# Patient Record
Sex: Female | Born: 1990 | Race: White | Hispanic: No | Marital: Single | Smoking: Former smoker
Health system: Southern US, Community
[De-identification: ages and names within clinical notes are randomized; demographics above are authoritative.]

## PROBLEM LIST (undated history)

## (undated) DIAGNOSIS — G43909 Migraine, unspecified, not intractable, without status migrainosus: Secondary | ICD-10-CM

## (undated) DIAGNOSIS — R479 Unspecified speech disturbances: Secondary | ICD-10-CM

## (undated) DIAGNOSIS — E669 Obesity, unspecified: Secondary | ICD-10-CM

## (undated) HISTORY — DX: Obesity, unspecified: E66.9

## (undated) HISTORY — DX: Unspecified speech disturbances: R47.9

---
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
## 2011-11-01 ENCOUNTER — Encounter: Payer: Self-pay | Admitting: Gynecology

## 2011-11-02 ENCOUNTER — Encounter: Payer: Self-pay | Admitting: Women's Health

## 2011-11-02 ENCOUNTER — Ambulatory Visit (INDEPENDENT_AMBULATORY_CARE_PROVIDER_SITE_OTHER): Payer: 59 | Admitting: Women's Health

## 2011-11-02 VITALS — BP 122/78 | Ht 64.0 in | Wt 161.0 lb

## 2011-11-02 DIAGNOSIS — Z01419 Encounter for gynecological examination (general) (routine) without abnormal findings: Secondary | ICD-10-CM

## 2011-11-02 DIAGNOSIS — Z113 Encounter for screening for infections with a predominantly sexual mode of transmission: Secondary | ICD-10-CM

## 2011-11-02 LAB — HEPATITIS C ANTIBODY: HCV Ab: NEGATIVE

## 2011-11-02 LAB — HEPATITIS B SURFACE ANTIGEN: Hepatitis B Surface Ag: NEGATIVE

## 2011-11-02 NOTE — Progress Notes (Signed)
Pamela Thornton 06-15-1991 [id number]    History:    The patient presents for annual exam.  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   Past medical history, past surgical history, family history and social history were all reviewed and documented in the EPIC chart.   ROS:  A  ROS was performed and pertinent positives and negatives are included in the history.  Exam:  Filed Vitals:   11/02/11 1052  BP: 122/78    General appearance:  Normal Head/Neck:  Normal, without cervical or supraclavicular adenopathy. Thyroid:  Symmetrical, normal in size, without palpable masses or nodularity. Respiratory  Effort:  Normal  Auscultation:  Clear without wheezing or rhonchi Cardiovascular  Auscultation:  Regular rate, without rubs, murmurs or gallops  Edema/varicosities:  Not grossly evident Abdominal  Soft,nontender, without masses, guarding or rebound.  Liver/spleen:  No organomegaly noted  Hernia:  None appreciated  Skin  Inspection:  Grossly normal  Palpation:  Grossly normal Neurologic/psychiatric  Orientation:  Normal with appropriate conversation.  Mood/affect:  Normal  Genitourinary    Breasts: Examined lying and sitting.     Right: Without masses, retractions, discharge or axillary adenopathy.     Left: Without masses, retractions, discharge or axillary adenopathy.   Inguinal/mons:  Normal without inguinal adenopathy  External genitalia:  Normal  BUS/Urethra/Skene's glands:  Normal  Bladder:  Normal  Vagina:  Normal  Cervix:  Normal  Uterus:   normal in size, shape and contour.  Midline and mobile  Adnexa/parametria:     Rt: Without masses or tenderness.   Lt: Without masses or tenderness.  Anus and perineum: Normal  Digital rectal exam:   Assessment/Plan:  20 y.o. S WF G0 for annual exam. Implanon, left arm, placed March of 2012 in [place]. Has had irregular periods that last about 5 days. Completed Gardasil in 2010. New partner  Normal GYN exam STD screen  Plan: CBC, UA,  GC/Chlamydia, HIV, RPR, hep B and C. SBEs, exercise, MVI daily encouraged. Campus safety reviewed. Condoms encouraged until permanent partner. Smokes several cigarettes a day did review importance of no smoking.   Harrington Challenger WHNP, 11:12 AM 11/02/2011

## 2012-04-12 ENCOUNTER — Ambulatory Visit (INDEPENDENT_AMBULATORY_CARE_PROVIDER_SITE_OTHER): Payer: 59 | Admitting: Internal Medicine

## 2012-04-12 DIAGNOSIS — R0602 Shortness of breath: Secondary | ICD-10-CM

## 2012-04-12 LAB — PULMONARY FUNCTION TEST

## 2012-04-12 NOTE — Progress Notes (Signed)
PFT done today. 

## 2012-04-18 ENCOUNTER — Encounter: Payer: Self-pay | Admitting: Internal Medicine

## 2012-07-30 ENCOUNTER — Encounter: Payer: Self-pay | Admitting: Gynecology

## 2012-07-30 ENCOUNTER — Ambulatory Visit (INDEPENDENT_AMBULATORY_CARE_PROVIDER_SITE_OTHER): Payer: 59 | Admitting: Gynecology

## 2012-07-30 DIAGNOSIS — R102 Pelvic and perineal pain: Secondary | ICD-10-CM

## 2012-07-30 DIAGNOSIS — N926 Irregular menstruation, unspecified: Secondary | ICD-10-CM

## 2012-07-30 DIAGNOSIS — N949 Unspecified condition associated with female genital organs and menstrual cycle: Secondary | ICD-10-CM

## 2012-07-30 NOTE — Patient Instructions (Signed)
Follow up for ultrasound as scheduled 

## 2012-07-30 NOTE — Progress Notes (Signed)
Patient presents having Implanon placed approximately 1-1/2 years ago. Has been having light menses every other month. Now has been bleeding for 2 weeks straight with pelvic cramping. No fever chills nausea vomiting diarrhea constipation. No urinary tract and flexion symptoms.  Past medical history,surgical history, medications, allergies, family history and social history were all reviewed and documented in the EPIC chart. ROS:  Was performed and pertinent positives and negatives are included in the history.  Exam with Sherrilyn Rist assistant Left upper extremity with Implanon in place. Abdomen soft nontender without masses guarding rebound organomegaly. Pelvic external BUS vagina with brown staining. Cervix normal. Uterus anteverted normal size midline mobile nontender. Adnexa without masses or tenderness.  Assessment and plan: Prolonged bleeding with cramping. GC Chlamydia screen done. Check baseline TSH prolactin hCG and ultrasound to look at endometrial echo/ovaries.  Various possibilities reviewed to include abnormalities in the above testing. If all normal suspect secondary to Implanon although a little unusual after year and a half placement. May treat with short burst of low-dose estrogen or progesterone if other testing normal.  The patient will follow up for lab results and ultrasound and we'll go from there.

## 2012-07-31 LAB — GC/CHLAMYDIA PROBE AMP, GENITAL
Chlamydia, DNA Probe: NEGATIVE
GC Probe Amp, Genital: NEGATIVE

## 2012-07-31 LAB — PROLACTIN: Prolactin: 5.4 ng/mL

## 2012-08-05 ENCOUNTER — Ambulatory Visit (INDEPENDENT_AMBULATORY_CARE_PROVIDER_SITE_OTHER): Payer: 59 | Admitting: Gynecology

## 2012-08-05 ENCOUNTER — Encounter: Payer: Self-pay | Admitting: Gynecology

## 2012-08-05 ENCOUNTER — Ambulatory Visit (INDEPENDENT_AMBULATORY_CARE_PROVIDER_SITE_OTHER): Payer: 59

## 2012-08-05 DIAGNOSIS — N8300 Follicular cyst of ovary, unspecified side: Secondary | ICD-10-CM

## 2012-08-05 DIAGNOSIS — N926 Irregular menstruation, unspecified: Secondary | ICD-10-CM

## 2012-08-05 DIAGNOSIS — N938 Other specified abnormal uterine and vaginal bleeding: Secondary | ICD-10-CM

## 2012-08-05 DIAGNOSIS — N949 Unspecified condition associated with female genital organs and menstrual cycle: Secondary | ICD-10-CM

## 2012-08-05 MED ORDER — ESTRADIOL 1 MG PO TABS: 1.0000 mg | ORAL_TABLET | Freq: Every day | ORAL | Status: DC

## 2012-08-05 NOTE — Progress Notes (Signed)
Patient follows up with several week history of bleeding on Implanon. Blood work to include TSH prolactin and hCG were negative. GC Chlamydia screen negative. Ultrasound today shows normal uterus with endometrial echo 2.5 mm. Right and left ovaries with physiologic changes. Cul-de-sac negative.  Assessment and plan DUB with Implanon. Thin endometrial echo. Will stabilize with estrogen supplement estradiol 1 mg daily x2 weeks. Assuming bleeding resolves then we'll follow. She is due for her annual exam in December and I reminded her to schedule this. If irregular bleeding continues then she'll represent and we will consider a more aggressive hormonal challenge such as low-dose oral contraceptives for one or 2 months.

## 2012-08-05 NOTE — Patient Instructions (Signed)
Take estrogen pill daily for 2 weeks. Call if irregular bleeding continues. Assuming that you're bleeding resolves then follow up in December 2013 for your annual exam.

## 2012-09-16 ENCOUNTER — Encounter: Payer: Self-pay | Admitting: Women's Health

## 2012-09-16 ENCOUNTER — Ambulatory Visit (INDEPENDENT_AMBULATORY_CARE_PROVIDER_SITE_OTHER): Payer: 59 | Admitting: Women's Health

## 2012-09-16 DIAGNOSIS — Z309 Encounter for contraceptive management, unspecified: Secondary | ICD-10-CM

## 2012-09-16 DIAGNOSIS — Z975 Presence of (intrauterine) contraceptive device: Secondary | ICD-10-CM

## 2012-09-16 NOTE — Progress Notes (Signed)
Patient ID: Pamela Thornton, female   DOB: 1991/03/18, 21 y.o.   MRN: [id number] Presents with a complaint of increased bleeding for last 2 months and questions if she should change contraception. Using 2-3 tampons daily for 10 days. Implanon was placed 01/2011 in [place], first 6 months no bleeding and then every 2-3 months. Was seen here 07/2012 for same problem, had a negative qualitative hCG, GC/Chlamydia, normal TSH and prolactin. Denies any bleeding today, urinary symptoms, vaginal discharge or pain, same partner.  Exam: Implanon palpable left upper inner arm  DUB on Implanon Contraception counseling  Plan: Options reviewed. Will try birth control pills for 2 cycles. Two sample packs of lo Loestrin given with instructions to start first day of next episode of bleeding to take daily. Reviewed slight risk for blood clots and strokes. Instructed to call if continued problems with spotting/bleeding or would like to continue on birth control pills and have Implanon removed.

## 2012-09-16 NOTE — Addendum Note (Signed)
Addended by: Harrington Challenger on: 09/16/2012 12:45 PM   Modules accepted: Level of Service

## 2012-11-04 ENCOUNTER — Encounter: Payer: Self-pay | Admitting: Women's Health

## 2012-11-04 ENCOUNTER — Ambulatory Visit (INDEPENDENT_AMBULATORY_CARE_PROVIDER_SITE_OTHER): Payer: 59 | Admitting: Women's Health

## 2012-11-04 VITALS — BP 136/86 | Ht 64.5 in | Wt 176.0 lb

## 2012-11-04 DIAGNOSIS — Z01419 Encounter for gynecological examination (general) (routine) without abnormal findings: Secondary | ICD-10-CM

## 2012-11-04 DIAGNOSIS — Z833 Family history of diabetes mellitus: Secondary | ICD-10-CM

## 2012-11-04 DIAGNOSIS — N898 Other specified noninflammatory disorders of vagina: Secondary | ICD-10-CM

## 2012-11-04 DIAGNOSIS — E079 Disorder of thyroid, unspecified: Secondary | ICD-10-CM

## 2012-11-04 LAB — CBC WITH DIFFERENTIAL/PLATELET
Basophils Relative: 0 % (ref 0–1)
HCT: 42.3 % (ref 36.0–46.0)
Hemoglobin: 14.5 g/dL (ref 12.0–15.0)
Lymphocytes Relative: 17 % (ref 12–46)
MCHC: 34.3 g/dL (ref 30.0–36.0)
Monocytes Absolute: 1.4 10*3/uL — ABNORMAL HIGH (ref 0.1–1.0)
Monocytes Relative: 11 % (ref 3–12)
Neutro Abs: 8.4 10*3/uL — ABNORMAL HIGH (ref 1.7–7.7)

## 2012-11-04 LAB — WET PREP FOR TRICH, YEAST, CLUE

## 2012-11-04 MED ORDER — TERCONAZOLE 0.8 % VA CREA: 1.0000 | TOPICAL_CREAM | Freq: Every day | VAGINAL | Status: DC

## 2012-11-04 NOTE — Progress Notes (Signed)
Pamela Thornton [birth year] [id number]    History:    The patient presents for annual exam.  Nexplanon placed February or March 2011/left upper arm in [place]. 3-5 day cycle every 1-3 months. Gardasil series completed 2010. Currently having cold symptoms using over-the-counter meds. Having slight vaginal itching. Same partner. 15 pound weight gain in one year.   Past medical history, past surgical history, family history and social history were all reviewed and documented in the EPIC chart. Hair dresser. Speech impediment.  ROS:  A  ROS was performed and pertinent positives and negatives are included in the history.  Exam:  Filed Vitals:   11/04/12 1103  BP: 136/86    General appearance:  Normal Head/Neck:  Normal, without cervical or supraclavicular adenopathy. Thyroid:  Symmetrical, normal in size, without palpable masses or nodularity. Respiratory  Effort:  Normal  Auscultation:  Clear without wheezing or rhonchi Cardiovascular  Auscultation:  Regular rate, without rubs, murmurs or gallops  Edema/varicosities:  Not grossly evident Abdominal  Soft,nontender, without masses, guarding or rebound.  Liver/spleen:  No organomegaly noted  Hernia:  None appreciated  Skin  Inspection:  Grossly normal  Palpation:  Grossly normal Neurologic/psychiatric  Orientation:  Normal with appropriate conversation.  Mood/affect:  Normal  Genitourinary    Breasts: Examined lying and sitting.     Right: Without masses, retractions, discharge or axillary adenopathy.     Left: Without masses, retractions, discharge or axillary adenopathy.   Inguinal/mons:  Normal without inguinal adenopathy  External genitalia:  Erythematous   BUS/Urethra/Skene's glands:  Normal  Bladder:  Normal  Vagina:  Moderate amount of a curdy discharge noted slight erythema wet prep positive for yeast.   Cervix:  Normal  Uterus:   normal in size, shape and contour.  Midline and mobile  Adnexa/parametria:      Rt: Without masses or tenderness.   Lt: Without masses or tenderness.  Anus and perineum: Normal   Assessment/Plan:  21 y.o. S. WF G0 for annual exam.     Nexplanon 12/2009 Yeast vaginitis Cold symptoms  Plan: Terazol 3 one applicator at bedtime x3, prescription, proper use given and reviewed instructed to call if no relief of symptoms. Continue over-the-counter cold medication, increase rest. SBE's,  calcium rich diet, decrease calories, increase exercise for weight loss and MVI daily encouraged. CBC, TSH, glucose, UA, Pap screening guidelines reviewed will start next year. Instructed to check blood pressure when off cold medicines, if continues greater than 130/80 seek care with primary care. Return to office in February fpr nexplanon to be removed and replaced with Dr. Audie Box.    Harrington Challenger [facility], 11:37 AM 11/04/2012

## 2012-11-04 NOTE — Patient Instructions (Addendum)
Health Maintenance, 65- to 21-Year-Old SCHOOL PERFORMANCE After high school completion, the young adult may be attending college, Scientist, product/process development or vocational school, or entering the [employer] or the work force. SOCIAL AND EMOTIONAL DEVELOPMENT The young adult establishes adult relationships and explores sexual identity. Young adults may be living at home or in a college dorm or apartment. Increasing independence is important with young adults. Throughout adolescence, teens should assume responsibility of their own health care. IMMUNIZATIONS Most young adults should be fully vaccinated. A booster dose of Tdap (tetanus, diphtheria, and pertussis, or "whooping cough"), a dose of meningococcal vaccine to protect against a certain type of bacterial meningitis, hepatitis A, human papillomarvirus (HPV), chickenpox, or measles vaccines may be indicated, if not given at an earlier age. Annual influenza or "flu" vaccination should be considered during flu season.  TESTING Annual screening for vision and hearing problems is recommended. Vision should be screened objectively at least once between 44 and 24 years of age. The young adult may be screened for anemia or tuberculosis. Young adults should have a blood test to check for high cholesterol during this time period. Young adults should be screened for use of alcohol and drugs. If the young adult is sexually active, screening for sexually transmitted infections, pregnancy, or HIV may be performed. Screening for cervical cancer should be performed within 3 years of beginning sexual activity. NUTRITION AND ORAL HEALTH  Adequate calcium intake is important. Consume 3 servings of low-fat milk and dairy products daily. For those who do not drink milk or consume dairy products, calcium enriched foods, such as juice, bread, or cereal, dark, leafy greens, or canned fish are alternate sources of calcium.  Drink plenty of water. Limit fruit juice to 8 to 12 ounces per day.  Avoid sugary beverages or sodas.  Discourage skipping meals, especially breakfast. Teens should eat a good variety of vegetables and fruits, as well as lean meats.  Avoid high fat, high salt, and high sugar foods, such as candy, chips, and cookies.  Encourage young adults to participate in meal planning and preparation.  Eat meals together as a family whenever possible. Encourage conversation at mealtime.  Limit fast food choices and eating out at restaurants.  Brush teeth twice a day and floss.  Schedule dental exams twice a year. SLEEP Regular sleep habits are important. PHYSICAL, SOCIAL, AND EMOTIONAL DEVELOPMENT  One hour of regular physical activity daily is recommended. Continue to participate in sports.  Encourage young adults to develop their own interests and consider community service or volunteerism.  Provide guidance to the young adult in making decisions about college and work plans.  Make sure that young adults know that they should never be in a situation that makes them uncomfortable, and they should tell partners if they do not want to engage in sexual activity.  Talk to the young adult about body image. Eating disorders may be noted at this time. Young adults may also be concerned about being overweight. Monitor the young adult for weight gain or loss.  Mood disturbances, depression, anxiety, alcoholism, or attention problems may be noted in young adults. Talk to the caregiver if there are concerns about mental illness.  Negotiate limit setting and independent decision making.  Encourage the young adult to handle conflict without physical violence.  Avoid loud noises which may impair hearing.  Limit television and computer time to 2 hours per day. Individuals who engage in excessive sedentary activity are more likely to become overweight. RISK BEHAVIORS  Sexually active  young adults need to take precautions against pregnancy and sexually transmitted  infections. Talk to young adults about contraception.  Provide a tobacco-free and drug-free environment for the young adult. Talk to the young adult about drug, tobacco, and alcohol use among friends or at friends' homes. Make sure the young adult knows that smoking tobacco or marijuana and taking drugs have health consequences and may impact brain development.  Teach the young adult about appropriate use of over-the-counter or prescription medicines.  Establish guidelines for driving and for riding with friends.  Talk to young adults about the risks of drinking and driving or boating. Encourage the young adult to call you if he or she or friends have been drinking or using drugs.  Remind young adults to wear seat belts at all times in cars and life vests in boats.  Young adults should always wear a properly fitted helmet when they are riding a bicycle.  Use caution with all-terrain vehicles (ATVs) or other motorized vehicles.  Do not keep handguns in the home. (If you do, the gun and ammunition should be locked separately and out of the young adult's access.)  Equip your home with smoke detectors and change the batteries regularly. Make sure all family members know the fire escape plans for your home.  Teach young adults not to swim alone and not to dive in shallow water.  All individuals should wear sunscreen that protects against UVA and UVB light with at least a sun protection factor (SPF) of 30 when out in the sun. This minimizes sun burning. WHAT'S NEXT? Young adults should visit their pediatrician or family physician yearly. By young adulthood, health care should be transitioned to a family physician or internal medicine specialist. Sexually active females may want to begin annual physical exams with a gynecologist. Document Released: 02/08/2007 Document Revised: 02/05/2012 Document Reviewed: 02/28/2007 [employer] Patient Information 2013 [place], [place]. Monilial Vaginitis Vaginitis  in a soreness, swelling and redness (inflammation) of the vagina and vulva. Monilial vaginitis is not a sexually transmitted infection. CAUSES  Yeast vaginitis is caused by yeast (candida) that is normally found in your vagina. With a yeast infection, the candida has overgrown in number to a point that upsets the chemical balance. SYMPTOMS   White, thick vaginal discharge.  Swelling, itching, redness and irritation of the vagina and possibly the lips of the vagina (vulva).  Burning or painful urination.  Painful intercourse. DIAGNOSIS  Things that may contribute to monilial vaginitis are:  Postmenopausal and virginal states.  Pregnancy.  Infections.  Being tired, sick or stressed, especially if you had monilial vaginitis in the past.  Diabetes. Good control will help lower the chance.  Birth control pills.  Tight fitting garments.  Using bubble bath, feminine sprays, douches or deodorant tampons.  Taking certain medications that kill germs (antibiotics).  Sporadic recurrence can occur if you become ill. TREATMENT  Your caregiver will give you medication.  There are several kinds of anti monilial vaginal creams and suppositories specific for monilial vaginitis. For recurrent yeast infections, use a suppository or cream in the vagina 2 times a week, or as directed.  Anti-monilial or steroid cream for the itching or irritation of the vulva may also be used. Get your caregiver's permission.  Painting the vagina with methylene blue solution may help if the monilial cream does not work.  Eating yogurt may help prevent monilial vaginitis. HOME CARE INSTRUCTIONS   Finish all medication as prescribed.  Do not have sex until treatment is completed  or after your caregiver tells you it is okay.  Take warm sitz baths.  Do not douche.  Do not use tampons, especially scented ones.  Wear cotton underwear.  Avoid tight pants and panty hose.  Tell your sexual partner that  you have a yeast infection. They should go to their caregiver if they have symptoms such as mild rash or itching.  Your sexual partner should be treated as well if your infection is difficult to eliminate.  Practice safer sex. Use condoms.  Some vaginal medications cause latex condoms to fail. Vaginal medications that harm condoms are:  Cleocin cream.  Butoconazole (Femstat).  Terconazole (Terazol) vaginal suppository.  Miconazole (Monistat) (may be purchased over the counter). SEEK MEDICAL CARE IF:   You have a temperature by mouth above 102 F (38.9 C).  The infection is getting worse after 2 days of treatment.  The infection is not getting better after 3 days of treatment.  You develop blisters in or around your vagina.  You develop vaginal bleeding, and it is not your menstrual period.  You have pain when you urinate.  You develop intestinal problems.  You have pain with sexual intercourse. Document Released: 08/23/2005 Document Revised: 02/05/2012 Document Reviewed: 05/07/2009 [facility] Patient Information 2013 [place], [place].

## 2012-11-05 LAB — URINALYSIS W MICROSCOPIC + REFLEX CULTURE
Casts: NONE SEEN
Crystals: NONE SEEN
Glucose, UA: NEGATIVE mg/dL
Leukocytes, UA: NEGATIVE
Specific Gravity, Urine: 1.027 (ref 1.005–1.030)
pH: 5.5 (ref 5.0–8.0)

## 2012-11-14 ENCOUNTER — Ambulatory Visit (INDEPENDENT_AMBULATORY_CARE_PROVIDER_SITE_OTHER): Payer: 59 | Admitting: Women's Health

## 2012-11-14 ENCOUNTER — Encounter: Payer: Self-pay | Admitting: Women's Health

## 2012-11-14 DIAGNOSIS — B373 Candidiasis of vulva and vagina: Secondary | ICD-10-CM

## 2012-11-14 DIAGNOSIS — B3731 Acute candidiasis of vulva and vagina: Secondary | ICD-10-CM

## 2012-11-14 LAB — WET PREP FOR TRICH, YEAST, CLUE
Clue Cells Wet Prep HPF POC: NONE SEEN
Trich, Wet Prep: NONE SEEN
Yeast Wet Prep HPF POC: NONE SEEN

## 2012-11-14 MED ORDER — FLUCONAZOLE 150 MG PO TABS: 150.0000 mg | ORAL_TABLET | Freq: Once | ORAL | Status: DC

## 2012-11-14 MED ORDER — TERCONAZOLE 0.4 % VA CREA: 1.0000 | TOPICAL_CREAM | Freq: Every day | VAGINAL | Status: DC

## 2012-11-14 NOTE — Addendum Note (Signed)
Addended by: Harrington Challenger on: 11/14/2012 03:31 PM   Modules accepted: Orders

## 2012-11-14 NOTE — Progress Notes (Signed)
Patient ID: Pamela Thornton, female   DOB: 01/10/1991, 21 y.o.   MRN: [id number] Presents with complaint of external vaginal itching. Was treated for a yeast infection with Terazol 3 at annual exam 2 weeks ago. States has not had good relief of symptoms. Denies urinary symptoms, fever, abdominal pain. Same partner. Contraceptives with nexplanon.  Exam: External genitalia erythematous, speculum exam scant amount of a white curdy discharge, present wet prep negative.  Symptomatic yeast  Plan: Reviewed wet prep negative, will treat with Diflucan 150 times one dose. Terazol 7 apply externally at bedtime for external itching. Discussed yeast prevention, wearing loose clothes. Instructed to call if no relief of symptoms.

## 2013-01-20 ENCOUNTER — Encounter: Payer: Self-pay | Admitting: Gynecology

## 2013-01-20 ENCOUNTER — Ambulatory Visit (INDEPENDENT_AMBULATORY_CARE_PROVIDER_SITE_OTHER): Payer: 59 | Admitting: Gynecology

## 2013-01-20 DIAGNOSIS — Z3046 Encounter for surveillance of implantable subdermal contraceptive: Secondary | ICD-10-CM

## 2013-01-20 DIAGNOSIS — Z309 Encounter for contraceptive management, unspecified: Secondary | ICD-10-CM

## 2013-01-20 MED ORDER — NORGESTIMATE-ETH ESTRADIOL 0.25-35 MG-MCG PO TABS: 1.0000 | ORAL_TABLET | Freq: Every day | ORAL | Status: DC

## 2013-01-20 NOTE — Patient Instructions (Addendum)
Leave the pressure dressing on the day of the procedure and removed the following day.  Leave the small Steri-Strips over the incision for a week or so and if it does not fall off by then go ahead and remove it. If there is any redness drainage or any other issues with the incision follow up for reexamination. Start on birth control pills now as we discussed. If you want to pursue an IUD then call the office. Otherwise follow up in December 2014 for your annual exam.

## 2013-01-20 NOTE — Progress Notes (Signed)
Patient presents to have her Implanon removed.  He was placed in February/March 2011 and is at her 3 year mark. She is unsure what she wants to do for contraception. I reviewed options with her to include pill patch ring Depo-Provera replacement of the Implanon and IUD. The risks/benefits, pros/cons of each choice discussed. Patient had used birth control pills a number of years ago but has some difficulty remembering is why she stopped.  Procedure: Under sterile technique the skin overlying the distal portion of the rod in the left upper arm was prepared with Betadine and infiltrated using 1% lidocaine. A small incision was made with a scalpel and using a small mosquito forcep to lyse the peri-capsular adhesions the rod was worked into the incision and subsequently grasped and delivered intact. It were shown to the patient and discarded. The skin incision was closed with a Steri-Strip and a pressure dressing was applied afterwards. Postoperative instructions were given to the patient to remove the dressing the following day and leave the Steri-Strips intact for the next week or so.  The patient wants to go ahead and try birth control pills as she feels that she can't remember them now. I prescribed Sprintec with instructions to start now. Assuming she does get started and does well then she will follow up in December 2014 for her annual, sooner as needed. I did give her literature on the Mirena IUD to review if she chooses to follow up for an IUD.

## 2013-01-23 ENCOUNTER — Encounter: Payer: Self-pay | Admitting: Women's Health

## 2013-01-29 ENCOUNTER — Encounter: Payer: Self-pay | Admitting: Women's Health

## 2013-03-04 ENCOUNTER — Encounter: Payer: Self-pay | Admitting: Women's Health

## 2013-03-04 ENCOUNTER — Telehealth: Payer: Self-pay | Admitting: *Deleted

## 2013-03-04 NOTE — Telephone Encounter (Signed)
Pt wants to change birth control pills to lo ovral. She is still having cramps and headaches with her period. Please advise

## 2013-03-05 MED ORDER — NORGESTREL-ETHINYL ESTRADIOL 0.3-30 MG-MCG PO TABS: 1.0000 | ORAL_TABLET | Freq: Every day | ORAL | Status: DC

## 2013-03-05 NOTE — Telephone Encounter (Signed)
Pt will be sent an email on the change.

## 2013-03-05 NOTE — Telephone Encounter (Signed)
Okay, please E. Scribe lo Orval, instruct her to finish out current pack. Instruct her to call if no relief of dysmenorrhea.

## 2013-05-02 ENCOUNTER — Encounter: Payer: Self-pay | Admitting: Women's Health

## 2013-07-24 ENCOUNTER — Ambulatory Visit: Payer: 59 | Admitting: Neurology

## 2013-08-06 ENCOUNTER — Encounter: Payer: Self-pay | Admitting: Neurology

## 2013-08-07 ENCOUNTER — Ambulatory Visit (INDEPENDENT_AMBULATORY_CARE_PROVIDER_SITE_OTHER): Payer: 59 | Admitting: Neurology

## 2013-08-07 ENCOUNTER — Encounter: Payer: Self-pay | Admitting: Neurology

## 2013-08-07 VITALS — BP 124/81 | HR 90 | Ht 64.5 in | Wt 178.0 lb

## 2013-08-07 DIAGNOSIS — R51 Headache: Secondary | ICD-10-CM

## 2013-08-07 MED ORDER — NORTRIPTYLINE HCL 10 MG PO CAPS: ORAL_CAPSULE | ORAL | Status: DC

## 2013-08-07 NOTE — Progress Notes (Signed)
Reason for visit: Headache  Pamela Thornton is a 22 y.o. female  History of present illness:  Ms. Lobdell is a 22 year old right-handed white female with a history of headaches throughout most of her life. The patient indicates that she recalls her headaches getting more severe when she was 46 or 22 years old. Over the last 3 years, the patient has not had a headache free day. The patient indicates the headaches are in the front and back of the head associated with a pressure sensation, occasionally a shooting pain. The headache severity may vary day to day, and she usually does not take anything for her headache. The patient does not miss work because of the headache. When the headache gets severe, she may take Aleve or Advil. The patient may have some nausea, no vomiting, and some blurred vision with the headache. The patient denies neck stiffness. The patient denies numbness or weakness of the face, arms, or legs. The patient denies any problems with balance, although she occasionally will have some dizziness with the headache. The patient denies problems controlling the bowels or the bladder. The patient may have some photophobia and phonophobia when headache is very severe. The patient does not recall any particular activating factors for the headache. The patient does not have allergy symptoms or sinus drainage. The patient is sent to this office for an evaluation.  Past Medical History  Diagnosis Date  . Speech impediment   . Headache(784.0) 08/07/2013  . Obesity     Past Surgical History  Procedure Laterality Date  . Implanon insertion  sometime between 2/12-4/12    inserted not by [facility]    Family History  Problem Relation Age of Onset  . Cancer Mother     Tonsil cancer  . Diabetes Father     type 2  . Migraines Father     Social history:  reports that she has been smoking Cigarettes.  She started smoking about 2 years ago. She has been smoking about 0.00 packs per day. She has  never used smokeless tobacco. She reports that  drinks alcohol. She reports that she does not use illicit drugs.  Medications:  Current Outpatient Prescriptions on File Prior to Visit  Medication Sig Dispense Refill  . norgestrel-ethinyl estradiol (LO/OVRAL) 0.3-30 MG-MCG tablet Take 1 tablet by mouth daily.  1 Package  11   No current facility-administered medications on file prior to visit.     No Known Allergies  ROS:  Out of a complete 14 system review of symptoms, the patient complains only of the following symptoms, and all other reviewed systems are negative.  Weight gain, fatigue Ringing in the ears, dizziness Blurred vision Cough, snoring Joint pain, achy muscles Confusion, headache Decreased energy, change in appetite, and disinterest in activities  Blood pressure 124/81, pulse 90, height 5' 4.5" (1.638 m), weight 178 lb (80.74 kg).  Physical Exam  General: The patient is alert and cooperative at the time of the examination. The patient is mildly obese.  Head: Pupils are equal, round, and reactive to light. Discs are flat bilaterally.  Neck: The neck is supple, no carotid bruits are noted.  Respiratory: The respiratory examination is clear.  Cardiovascular: The cardiovascular examination reveals a regular rate and rhythm, no obvious murmurs or rubs are noted.  Neuromuscular: Range of movement of the neck is full and normal. No crepitus is noted on the temporomandibular joints. No tenderness over the frontal and maxillary sinuses is noted.  Skin: Extremities are without  significant edema.  Neurologic Exam  Mental status:  Cranial nerves: Facial symmetry is present. There is good sensation of the face to pinprick and soft touch bilaterally. The strength of the facial muscles and the muscles to head turning and shoulder shrug are normal bilaterally. Speech is well enunciated, no aphasia or dysarthria is noted. Extraocular movements are full. Visual fields are  full.  Motor: The motor testing reveals 5 over 5 strength of all 4 extremities. Good symmetric motor tone is noted throughout.  Sensory: Sensory testing is intact to pinprick, soft touch, vibration sensation, and position sense on all 4 extremities. No evidence of extinction is noted.  Coordination: Cerebellar testing reveals good finger-nose-finger and heel-to-shin bilaterally.  Gait and station: Gait is normal. Tandem gait is normal. Romberg is negative. No drift is seen.  Reflexes: Deep tendon reflexes are symmetric and normal bilaterally. Toes are downgoing bilaterally.   Assessment/Plan:  1. Chronic daily headache  The patient likely has primarily muscle tension headaches. The patient could have occasional migraine as well. The patient will be started on nortriptyline. The patient was cautioned about overusing medications for her headache. The patient indicates that she does not take medications daily for her headache. The patient drinks 2 caffeinated beverages daily, which does not appear to be excessive. The patient will contact our office if she has problems with tolerance of the medication, or she believes that the medication dose should be increased. The patient will followup in 4 months.  Marlan Palau MD 08/07/2013 5:24 PM  [facility] [address] [place], [place] [address]  Phone [phone] Fax [phone]

## 2013-10-02 ENCOUNTER — Other Ambulatory Visit: Payer: Self-pay

## 2013-12-09 ENCOUNTER — Ambulatory Visit (INDEPENDENT_AMBULATORY_CARE_PROVIDER_SITE_OTHER): Payer: 59 | Admitting: Nurse Practitioner

## 2013-12-09 ENCOUNTER — Encounter: Payer: Self-pay | Admitting: Nurse Practitioner

## 2013-12-09 VITALS — BP 117/77 | HR 86 | Ht 65.5 in | Wt 179.0 lb

## 2013-12-09 DIAGNOSIS — R51 Headache: Secondary | ICD-10-CM

## 2013-12-09 NOTE — Patient Instructions (Signed)
Given a list of common migraine triggers Keep headache diary and bring to next appt Try Relpax acutely samples with co pay card given Increase Nortriptyline by one capsule weekly until the dose is 30mg .  F/U in 2 months

## 2013-12-09 NOTE — Progress Notes (Signed)
I have read the note, and I agree with the clinical assessment and plan.  Kashayla Ungerer KEITH   

## 2013-12-09 NOTE — Progress Notes (Signed)
[facility]  PATIENT: Anaaya Fuster DOB: 02/27/1991   REASON FOR VISIT: Followup for migraine/headache   HISTORY OF PRESENT ILLNESS:Ms Spagnolo, 23 year old female returns for followup. She was initially evaluated for headaches 08/07/2013 by Dr. Anne Hahn she was placed on nortriptyline and finds the medication worked for about a month and then she had several bad headaches so she titrated down to one tablet daily or 10 mg. Her headaches are in the front and back associated with a pressure sensation, occasional shooting pain. The intensity of the headache varies day to day, for instance today it is a 2/10 on the pain scale. She is taking a lot of Tylenol and rebound headache was discussed. She has never taken a triptan  for the severe headaches. The patient denies any weakness numbness of the face arms or legs. She has not had any balance problems she occasionally has photophobia and phonophobia with a severe headache. She is not aware of any aggravating factors. She denies missing any work. She returns for reevaluation  HISTORY:  headaches throughout most of her life. The patient indicates that she recalls her headaches getting more severe when she was 26 or 23 years old. Over the last 3 years, the patient has not had a headache free day. The patient indicates the headaches are in the front and back of the head associated with a pressure sensation, occasionally a shooting pain. The headache severity may vary day to day, and she usually does not take anything for her headache. The patient does not miss work because of the headache. When the headache gets severe, she may take Aleve or Advil. The patient may have some nausea, no vomiting, and some blurred vision with the headache. The patient denies neck stiffness. The patient denies numbness or weakness of the face, arms, or legs. The patient denies any problems with balance, although she occasionally will have some dizziness with the  headache. The patient denies problems controlling the bowels or the bladder. The patient may have some photophobia and phonophobia when headache is very severe. The patient does not recall any particular activating factors for the headache. The patient does not have allergy symptoms or sinus drainage   REVIEW OF SYSTEMS: Full 14 system review of systems performed and notable only for those listed, all others are neg:  Constitutional: N/A  Cardiovascular: N/A  Ear/Nose/Throat: Ringing in the ears  Skin: N/A  Eyes: N/A  Respiratory: N/A  Gastroitestinal: N/A  Hematology/Lymphatic: N/A  Endocrine: N/A Musculoskeletal:N/A  Allergy/Immunology: N/A  Neurological: Headache Psychiatric: N/A   ALLERGIES: No Known Allergies  HOME MEDICATIONS: Outpatient Prescriptions Prior to Visit  Medication Sig Dispense Refill  . ibuprofen (ADVIL,MOTRIN) 200 MG tablet Take 400 mg by mouth every 6 (six) hours as needed for pain.      . nortriptyline (PAMELOR) 10 MG capsule Take one capsule at night for one week, then take 2 capsules at night for one week, then take 3 capsules at night  90 capsule  3  . norgestrel-ethinyl estradiol (LO/OVRAL) 0.3-30 MG-MCG tablet Take 1 tablet by mouth daily.  1 Package  11   No facility-administered medications prior to visit.    PAST MEDICAL HISTORY: Past Medical History  Diagnosis Date  . Speech impediment   . Headache(784.0) 08/07/2013  . Obesity     PAST SURGICAL HISTORY: Past Surgical History  Procedure Laterality Date  . Implanon insertion  sometime between 2/12-4/12    inserted not by [facility]    FAMILY HISTORY:  Family History  Problem Relation Age of Onset  . Cancer Mother     Tonsil cancer  . Diabetes Father     type 2  . Migraines Father     SOCIAL HISTORY: History   Social History  . Marital Status: Single    Spouse Name: N/A    Number of Children: 0  . Years of Education: 14   Occupational History  .  Other   Social History Main  Topics  . Smoking status: Former Smoker    Types: Cigarettes    Start date: 08/08/2011  . Smokeless tobacco: Never Used  . Alcohol Use: Yes     Comment: rarely  . Drug Use: No  . Sexual Activity: Yes    Birth Control/ Protection: Implant   Other Topics Concern  . Not on file   Social History Narrative   Patient is single and lives with her parents.   Patient is working full-time.   Patient has an Associates Degree.   Patient is right-handed.   Patient drinks 2-3 cups of coffee daily, and 1 soda daily.     PHYSICAL EXAM  Filed Vitals:   12/09/13 0933  BP: 117/77  Pulse: 86  Height: 5' 5.5" (1.664 m)  Weight: 179 lb (81.194 kg)   Body mass index is 29.32 kg/(m^2).  Generalized: Well developed, in no acute distress  Head: normocephalic and atraumatic,. Oropharynx benign  Neck: Supple, no carotid bruits  Cardiac: Regular rate rhythm, no murmur  Musculoskeletal: No deformity   Neurological examination   Mentation: Alert oriented to time, place, history taking. Follows all commands speech and language fluent  Cranial nerve II-XII: Pupils were equal round reactive to light extraocular movements were full, visual field were full on confrontational test. Facial sensation and strength were normal. hearing was intact to finger rubbing bilaterally. Uvula tongue midline. head turning and shoulder shrug were normal and symmetric.Tongue protrusion into cheek strength was normal. Motor: normal bulk and tone, full strength in the BUE, BLE, fine finger movements normal, no pronator drift. No focal weakness Coordination: finger-nose-finger, heel-to-shin bilaterally, no dysmetria Reflexes: Brachioradialis 2/2, biceps 2/2, triceps 2/2, patellar 2/2, Achilles 2/2, plantar responses were flexor bilaterally. Gait and Station: Rising up from seated position without assistance, normal stance,  moderate stride, good arm swing, smooth turning, able to perform tiptoe, and heel walking without  difficulty. Tandem gait is steady  DIAGNOSTIC DATA (LABS, IMAGING, TESTING) -None to review   ASSESSMENT AND PLAN  23 y.o. year old female  has a past medical history of Speech impediment; WUJWJXBJ(478.2Headache(784.0) (08/07/2013); and Obesity. here to followup. She has varying degrees of headache from mild to severe. She is taking a lot of Tylenol 800 mg several times a day.   Given a list of common migraine triggers Keep headache diary and bring to next appt Given information on rebound headache and importance of limiting Tylenol use.  Try Relpax acutely for severe headache samples with co pay card given Increase Nortriptyline by one capsule weekly until the dose is 30mg .  F/U in 2 months Nilda RiggsNancy Carolyn Jahzion Brogden, [facility] HospitalGNP, [facility]BC, APRN  [facility] [address] [place], [place] [address] [phone]

## 2013-12-23 ENCOUNTER — Encounter: Payer: Self-pay | Admitting: Women's Health

## 2013-12-26 NOTE — Telephone Encounter (Signed)
Message left

## 2013-12-30 ENCOUNTER — Encounter: Payer: Self-pay | Admitting: Women's Health

## 2013-12-30 ENCOUNTER — Other Ambulatory Visit (HOSPITAL_COMMUNITY)
Admission: RE | Admit: 2013-12-30 | Discharge: 2013-12-30 | Disposition: A | Discharge status: Home / Self Care | Payer: 59 | Source: Ambulatory Visit | Attending: Gynecology | Admitting: Gynecology

## 2013-12-30 ENCOUNTER — Ambulatory Visit (INDEPENDENT_AMBULATORY_CARE_PROVIDER_SITE_OTHER): Payer: 59 | Admitting: Women's Health

## 2013-12-30 VITALS — Ht 64.5 in | Wt 176.0 lb

## 2013-12-30 DIAGNOSIS — Z833 Family history of diabetes mellitus: Secondary | ICD-10-CM

## 2013-12-30 DIAGNOSIS — Z01419 Encounter for gynecological examination (general) (routine) without abnormal findings: Secondary | ICD-10-CM | POA: Insufficient documentation

## 2013-12-30 LAB — CBC WITH DIFFERENTIAL/PLATELET
BASOS PCT: 0 % (ref 0–1)
Basophils Absolute: 0 10*3/uL (ref 0.0–0.1)
EOS ABS: 0.1 10*3/uL (ref 0.0–0.7)
EOS PCT: 1 % (ref 0–5)
HCT: 39.6 % (ref 36.0–46.0)
Hemoglobin: 13.9 g/dL (ref 12.0–15.0)
LYMPHS ABS: 2.1 10*3/uL (ref 0.7–4.0)
Lymphocytes Relative: 29 % (ref 12–46)
MCH: 30.1 pg (ref 26.0–34.0)
MCHC: 35.1 g/dL (ref 30.0–36.0)
MCV: 85.7 fL (ref 78.0–100.0)
Monocytes Absolute: 0.6 10*3/uL (ref 0.1–1.0)
Monocytes Relative: 8 % (ref 3–12)
NEUTROS PCT: 62 % (ref 43–77)
Neutro Abs: 4.5 10*3/uL (ref 1.7–7.7)
PLATELETS: 331 10*3/uL (ref 150–400)
RBC: 4.62 MIL/uL (ref 3.87–5.11)
RDW: 12.9 % (ref 11.5–15.5)
WBC: 7.3 10*3/uL (ref 4.0–10.5)

## 2013-12-30 LAB — GLUCOSE, RANDOM: Glucose, Bld: 84 mg/dL (ref 70–99)

## 2013-12-30 NOTE — Progress Notes (Signed)
Pamela CaldronOlivia Thornton 03-Jan-1991 [id number]    History:    Presents for annual exam.  Monthly exams/condoms/same partner. Gardasil series completed. Having problems with headaches [facility] new medication helping. Having intermittent right lower quadrant pain.  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  Exam:  There were no vitals filed for this visit.  General appearance:  Normal Thyroid:  Symmetrical, normal in size, without palpable masses or nodularity. Respiratory  Auscultation:  Clear without wheezing or rhonchi Cardiovascular  Auscultation:  Regular rate, without rubs, murmurs or gallops  Edema/varicosities:  Not grossly evident Abdominal  Soft,nontender, without masses, guarding or rebound.  Liver/spleen:  No organomegaly noted  Hernia:  None appreciated  Skin  Inspection:  Grossly normal   Breasts: Examined lying and sitting.     Right: Without masses, retractions, discharge or axillary adenopathy.     Left: Without masses, retractions, discharge or axillary adenopathy. Gentitourinary   Inguinal/mons:  Normal without inguinal adenopathy  External genitalia:  Normal  BUS/Urethra/Skene's glands:  Normal  Vagina:  Normal  Cervix:  Normal  Uterus:   normal in size, shape and contour.  Midline and mobile  Adnexa/parametria:     Rt: Without masses or tenderness.   Lt: Without masses or tenderness.  Anus and perineum: Normal    Assessment/Plan:  23 y.o. SWF G0 for annual exam.    Contraception management Right lower intermittent pain Headaches without aura  Plan: Contraception options reviewed will continue condoms. Plan B emergency contraception reviewed. Mirena IUD information given and reviewed risk for infection, perforation, hemorrhage. If  would like to try will call back. SBE's, regular exercise, calcium rich diet, MVI daily encouraged. CBC, glucose, UA, Pap. Schedule ultrasound,  YOUNG,NANCY J WHNP, 1:22 PM 12/30/2013

## 2013-12-30 NOTE — Patient Instructions (Signed)

## 2013-12-31 LAB — URINALYSIS W MICROSCOPIC + REFLEX CULTURE
Bilirubin Urine: NEGATIVE
Casts: NONE SEEN
Crystals: NONE SEEN
GLUCOSE, UA: 250 mg/dL — AB
HGB URINE DIPSTICK: NEGATIVE
Ketones, ur: NEGATIVE mg/dL
LEUKOCYTES UA: NEGATIVE
Nitrite: NEGATIVE
Protein, ur: NEGATIVE mg/dL
SQUAMOUS EPITHELIAL / LPF: NONE SEEN
Specific Gravity, Urine: >1.03 — ABNORMAL HIGH (ref 1.005–1.030)
Urobilinogen, UA: 0.2 mg/dL (ref 0.0–1.0)
pH: 6 (ref 5.0–8.0)

## 2014-03-16 ENCOUNTER — Encounter: Payer: Self-pay | Admitting: Nurse Practitioner

## 2014-03-16 ENCOUNTER — Ambulatory Visit (INDEPENDENT_AMBULATORY_CARE_PROVIDER_SITE_OTHER): Payer: 59 | Admitting: Nurse Practitioner

## 2014-03-16 VITALS — BP 112/63 | HR 83 | Ht 65.5 in | Wt 173.0 lb

## 2014-03-16 DIAGNOSIS — R51 Headache: Secondary | ICD-10-CM

## 2014-03-16 MED ORDER — RIZATRIPTAN BENZOATE 10 MG PO TBDP: 10.0000 mg | ORAL_TABLET | ORAL | Status: DC | PRN

## 2014-03-16 NOTE — Progress Notes (Signed)
[facility]  PATIENT: Pamela Thornton DOB: 09-12-1991   REASON FOR VISIT: follow up for migraine    HISTORY OF PRESENT ILLNESS: Ms. Pamela Thornton, 23 year old female returns for followup. She was last seen in this office 12/09/2013. She is currently on nortriptyline but does not consistently take the medication because it makes her too drowsy in the morning. She has stopped her Tylenol. She was given samples of Relpax at her last visit she did not like the way the medication made her feel but  it did take away the headache. 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 She denies any weakness, numbness of the face arms or legs. She occasionally has photophobia and phonophobia with  severe headaches she will occasionally be nauseated. She has cut out her fast food  since last seen and is eating a healthy diet. She is having maybe one to 2 headaches at the most monthly. She stopped keeping her record when they became less frequent. She returns for reevaluation.  HISTORY:headaches throughout most of her life. The patient indicates that she recalls her headaches getting more severe when she was [age] or 23 years old. Over the last 3 years, the patient has not had a headache free day. The patient indicates the headaches are in the front and back of the head associated with a pressure sensation, occasionally a shooting pain. The headache severity may vary day to day, and she usually does not take anything for her headache. The patient does not miss work because of the headache. When the headache gets severe, she may take Aleve or Advil. The patient may have some nausea, no vomiting, and some blurred vision with the headache. The patient denies neck stiffness. The patient denies numbness or weakness of the face, arms, or legs. The patient denies any problems with balance, although she occasionally will have some dizziness with the headache. The patient denies  problems controlling the bowels or the bladder. The patient may have some photophobia and phonophobia when headache is very severe. The patient does not recall any particular activating factors for the headache. The patient does not have allergy symptoms or sinus drainage      REVIEW OF SYSTEMS: Full 14 system review of systems performed and notable only for those listed, all others are neg:  Constitutional: N/A  Cardiovascular: N/A  Ear/Nose/Throat: N/A  Skin: N/A  Eyes: N/A  Respiratory: N/A  Gastroitestinal: N/A  Hematology/Lymphatic: N/A  Endocrine: N/A Musculoskeletal:N/A  Allergy/Immunology: N/A  Neurological: N/A Psychiatric: N/A   ALLERGIES: No Known Allergies  HOME MEDICATIONS: Outpatient Prescriptions Prior to Visit  Medication Sig Dispense Refill  . ibuprofen (ADVIL,MOTRIN) 200 MG tablet Take 400 mg by mouth every 6 (six) hours as needed for pain.      . nortriptyline (PAMELOR) 10 MG capsule Take one capsule at night for one week, then take 2 capsules at night for one week, then take 3 capsules at night  90 capsule  3   No facility-administered medications prior to visit.    PAST MEDICAL HISTORY: Past Medical History  Diagnosis Date  . Speech impediment   . Headache(784.0) 08/07/2013  . Obesity     PAST SURGICAL HISTORY: Past Surgical History  Procedure Laterality Date  . Implanon insertion  sometime between 2/12-4/12    inserted not by [facility]    FAMILY HISTORY: Family History  Problem Relation Age of Onset  . Cancer Mother  Tonsil cancer  . Diabetes Father     type 2  . Migraines Father     SOCIAL HISTORY: History   Social History  . Marital Status: Single    Spouse Name: N/A    Number of Children: 0  . Years of Education: 14   Occupational History  .  Other   Social History Main Topics  . Smoking status: Former Smoker    Types: Cigarettes    Start date: 08/08/2011  . Smokeless tobacco: Never Used  . Alcohol Use: Yes     Comment:  rarely  . Drug Use: No  . Sexual Activity: Yes   Other Topics Concern  . Not on file   Social History Narrative   Patient is single and lives with her parents.   Patient is working full-time.   Patient has an Associates Degree.   Patient is right-handed.   Patient drinks 2-3 cups of coffee daily, and 1 soda daily.     PHYSICAL EXAM  Filed Vitals:   03/16/14 0835  BP: 112/63  Pulse: 83  Height: 5' 5.5" (1.664 m)  Weight: 173 lb (78.472 kg)   Body mass index is 28.34 kg/(m^2).  Generalized: Well developed, mildly obese female in no acute distress  Head: normocephalic and atraumatic,. Oropharynx benign  Neck: Supple, no carotid bruits  Cardiac: Regular rate rhythm, no murmur  Musculoskeletal: No deformity   Neurological examination   Mentation: Alert oriented to time, place, history taking. Follows all commands speech and language fluent  Cranial nerve II-XII: Pupils were equal round reactive to light extraocular movements were full, visual field were full on confrontational test. Facial sensation and strength were normal. hearing was intact to finger rubbing bilaterally. Uvula tongue midline. head turning and shoulder shrug were normal and symmetric.Tongue protrusion into cheek strength was normal. Motor: normal bulk and tone, full strength in the BUE, BLE,  No focal weakness Coordination: finger-nose-finger, heel-to-shin bilaterally, no dysmetria Reflexes: Brachioradialis 2/2, biceps 2/2, triceps 2/2, patellar 2/2, Achilles 2/2, plantar responses were flexor bilaterally. Gait and Station: Rising up from seated position without assistance, normal stance,  moderate stride, good arm swing, smooth turning, able to perform tiptoe, and heel walking without difficulty. Tandem gait is steady  DIAGNOSTIC DATA (LABS, IMAGING, TESTING) - I reviewed patient records, labs, notes, testing and imaging myself where available.  Lab Results  Component Value Date   WBC 7.3 12/30/2013   HGB  13.9 12/30/2013   HCT 39.6 12/30/2013   MCV 85.7 12/30/2013   PLT 331 12/30/2013      Component Value Date/Time   GLUCOSE 84 12/30/2013 1109     ASSESSMENT AND PLAN  23 y.o. year old female  has a past medical history of Speech impediment; UJWJXBJY(782.9Headache(784.0) (08/07/2013); and Obesity. here to follow up for her migraines. She has cut out fast food with an improvement in her migraines.  Continue nortriptyline 10-20 mg as a preventive at night Switch triptan to Maxalt MLT will order Followup in 4 months Call for increase in headaches Nilda RiggsNancy Carolyn Martin, [facility] CenterGNP, [facility]BC, APRN  [facility] [address], [address] [place], [place] [address] [phone]

## 2014-03-16 NOTE — Progress Notes (Signed)
I have read the note, and I agree with the clinical assessment and plan.  Charles K Willis   

## 2014-03-16 NOTE — Patient Instructions (Signed)
Continue nortriptyline 10-20 mg as a preventive at night Switch triptan to Maxalt melt will reorder Followup in 4 months Call for increase in headaches

## 2014-07-10 ENCOUNTER — Encounter: Payer: Self-pay | Admitting: Nurse Practitioner

## 2014-07-10 ENCOUNTER — Ambulatory Visit (INDEPENDENT_AMBULATORY_CARE_PROVIDER_SITE_OTHER): Payer: 59 | Admitting: Nurse Practitioner

## 2014-07-10 VITALS — BP 120/73 | HR 90 | Ht 65.0 in | Wt 180.0 lb

## 2014-07-10 DIAGNOSIS — R51 Headache: Secondary | ICD-10-CM

## 2014-07-10 MED ORDER — NORTRIPTYLINE HCL 10 MG PO CAPS: 20.0000 mg | ORAL_CAPSULE | Freq: Every day | ORAL | Status: DC

## 2014-07-10 MED ORDER — SUMATRIPTAN SUCCINATE 100 MG PO TABS: 100.0000 mg | ORAL_TABLET | ORAL | Status: DC | PRN

## 2014-07-10 NOTE — Progress Notes (Addendum)
[facility]  PATIENT: Pamela Thornton DOB: 1991/09/26   REASON FOR VISIT: Followup for headache   HISTORY OF PRESENT ILLNESS:[name], 23 -year-old  female returns for followup. She was last seen in this office 03/16/2014. She is currently on nortriptyline 20mg  at night.  She has failed  Relpax and Maxalt. 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 She denies any weakness, numbness of the face arms or legs. She occasionally has photophobia and phonophobia with severe headaches she will occasionally be nauseated. She has cut out her fast food since last seen and is eating a healthy diet. She is having maybe one to 1-2 headaches at the most monthly. She stopped keeping her record when they became less frequent. She returns for reevaluation. She is asking for a different triptan.   HISTORY:headaches throughout most of her life. The patient indicates that she recalls her headaches getting more severe when she was [age] or 23 years old. Over the last 3 years, the patient has not had a headache free day. The patient indicates the headaches are in the front and back of the head associated with a pressure sensation, occasionally a shooting pain. The headache severity may vary day to day, and she usually does not take anything for her headache. The patient does not miss work because of the headache. When the headache gets severe, she may take Aleve or Advil. The patient may have some nausea, no vomiting, and some blurred vision with the headache. The patient denies neck stiffness. The patient denies numbness or weakness of the face, arms, or legs. The patient denies any problems with balance, although she occasionally will have some dizziness with the headache. The patient denies problems controlling the bowels or the bladder. The patient may have some photophobia and phonophobia when headache is very severe. The patient does not recall any  particular activating factors for the headache. The patient does not have allergy symptoms or sinus drainage      REVIEW OF SYSTEMS: Full 14 system review of systems performed and notable only for those listed, all others are neg:  Constitutional: N/A  Cardiovascular: N/A  Ear/Nose/Throat: Ringing in the ears Skin: N/A  Eyes: N/A  Respiratory: N/A  Gastroitestinal: N/A  Hematology/Lymphatic: N/A  Endocrine: N/A Musculoskeletal:N/A  Allergy/Immunology: N/A  Neurological: N/A Psychiatric: N/A Sleep : NA   ALLERGIES: No Known Allergies  HOME MEDICATIONS: Outpatient Prescriptions Prior to Visit  Medication Sig Dispense Refill  . ibuprofen (ADVIL,MOTRIN) 200 MG tablet Take 400 mg by mouth every 6 (six) hours as needed for pain.      . nortriptyline (PAMELOR) 10 MG capsule Take 20 mg by mouth at bedtime.      . rizatriptan (MAXALT-MLT) 10 MG disintegrating tablet Take 1 tablet (10 mg total) by mouth as needed for migraine. May repeat in 2 hours if needed  10 tablet  1   No facility-administered medications prior to visit.    PAST MEDICAL HISTORY: Past Medical History  Diagnosis Date  . Speech impediment   . Headache(784.0) 08/07/2013  . Obesity     PAST SURGICAL HISTORY: Past Surgical History  Procedure Laterality Date  . Implanon insertion  sometime between 2/12-4/12    inserted not by [facility]    FAMILY HISTORY: Family History  Problem Relation Age of Onset  . Cancer Mother     Tonsil cancer  . Diabetes Father     type 2  .  Migraines Father     SOCIAL HISTORY: History   Social History  . Marital Status: Single    Spouse Name: N/A    Number of Children: 0  . Years of Education: 14   Occupational History  .  Other   Social History Main Topics  . Smoking status: Former Smoker    Types: Cigarettes    Start date: 08/08/2011  . Smokeless tobacco: Never Used  . Alcohol Use: Yes     Comment: rarely  . Drug Use: No  . Sexual Activity: Yes   Other Topics  Concern  . Not on file   Social History Narrative   Patient is single and lives with her parents.   Patient is working full-time.   Patient has an Associates Degree.   Patient is right-handed.   Patient drinks 2-3 cups of coffee daily, and 1 soda daily.     PHYSICAL EXAM  Filed Vitals:   07/10/14 0923  BP: 120/73  Pulse: 90  Height: 5\' 5"  (1.651 m)  Weight: 180 lb (81.647 kg)   Body mass index is 29.95 kg/(m^2). Generalized: Well developed, mildly obese female in no acute distress  Head: normocephalic and atraumatic,. Oropharynx benign  Neck: Supple, no carotid bruits  Cardiac: Regular rate rhythm, no murmur  Musculoskeletal: No deformity  Neurological examination  Mentation: Alert oriented to time, place, history taking. Follows all commands speech and language fluent  Cranial nerve II-XII: Pupils were equal round reactive to light extraocular movements were full, visual field were full on confrontational test. Facial sensation and strength were normal. hearing was intact to finger rubbing bilaterally. Uvula tongue midline. head turning and shoulder shrug were normal and symmetric.Tongue protrusion into cheek strength was normal.  Motor: normal bulk and tone, full strength in the BUE, BLE, No focal weakness  Coordination: finger-nose-finger, heel-to-shin bilaterally, no dysmetria  Reflexes: Brachioradialis 2/2, biceps 2/2, triceps 2/2, patellar 2/2, Achilles 2/2, plantar responses were flexor bilaterally.  Gait and Station: Rising up from seated position without assistance, normal stance, moderate stride, good arm swing, smooth turning, able to perform tiptoe, and   tandem gait is steady   DIAGNOSTIC DATA (LABS, IMAGING, TESTING) - I reviewed patient records, labs, notes, testing and imaging myself where available.  Lab Results  Component Value Date   WBC 7.3 12/30/2013   HGB 13.9 12/30/2013   HCT 39.6 12/30/2013   MCV 85.7 12/30/2013   PLT 331 12/30/2013      Component Value  Date/Time   GLUCOSE 84 12/30/2013 1109       ASSESSMENT AND PLAN  23 y.o. year old female  has a past medical history of Speech impediment; ZOXWRUEA(540.9) (08/07/2013); and Obesity. here to followup. She is a patient of Dr. Anne Hahn who is out of the office  Continue Pamelor at current dose, will refill Switch to Imitrex acutely, will refill Followup in 6-8 months Call for increase in headaches Nilda Riggs, [facility], [facility], APRN  [facility] [address], [address] [place], [place] [address] [phone]  I reviewed the above note and documentation by the Nurse Practitioner and agree with the history, physical exam, assessment and plan as outlined above. Huston Foley, MD, PhD [facility])

## 2014-07-10 NOTE — Patient Instructions (Signed)
Continue Pamelor at current dose Switch to Imitrex acutely Followup in 6-8 months Call for increase in headaches

## 2014-07-16 ENCOUNTER — Ambulatory Visit: Payer: 59 | Admitting: Nurse Practitioner

## 2014-11-30 ENCOUNTER — Telehealth: Payer: Self-pay | Admitting: *Deleted

## 2014-11-30 NOTE — Telephone Encounter (Signed)
Pt called requesting to have implanon placed, I called pt back and informed her OV with [facility] needed to speak with about this. And benefits will have to be checked. Pt will call back to schedule.

## 2014-12-30 ENCOUNTER — Encounter: Payer: Self-pay | Admitting: Women's Health

## 2014-12-31 ENCOUNTER — Encounter: Payer: Self-pay | Admitting: Women's Health

## 2014-12-31 ENCOUNTER — Other Ambulatory Visit (HOSPITAL_COMMUNITY)
Admission: RE | Admit: 2014-12-31 | Discharge: 2014-12-31 | Disposition: A | Discharge status: Home / Self Care | Payer: 59 | Source: Ambulatory Visit | Attending: Women's Health | Admitting: Women's Health

## 2014-12-31 ENCOUNTER — Ambulatory Visit (INDEPENDENT_AMBULATORY_CARE_PROVIDER_SITE_OTHER): Payer: 59 | Admitting: Women's Health

## 2014-12-31 ENCOUNTER — Telehealth: Payer: Self-pay | Admitting: Gynecology

## 2014-12-31 ENCOUNTER — Other Ambulatory Visit: Payer: Self-pay | Admitting: Gynecology

## 2014-12-31 VITALS — BP 122/80 | Ht 65.0 in | Wt 187.0 lb

## 2014-12-31 DIAGNOSIS — Z1151 Encounter for screening for human papillomavirus (HPV): Secondary | ICD-10-CM | POA: Insufficient documentation

## 2014-12-31 DIAGNOSIS — Z833 Family history of diabetes mellitus: Secondary | ICD-10-CM

## 2014-12-31 DIAGNOSIS — Z01419 Encounter for gynecological examination (general) (routine) without abnormal findings: Secondary | ICD-10-CM

## 2014-12-31 DIAGNOSIS — Z30431 Encounter for routine checking of intrauterine contraceptive device: Secondary | ICD-10-CM

## 2014-12-31 DIAGNOSIS — Z01411 Encounter for gynecological examination (general) (routine) with abnormal findings: Secondary | ICD-10-CM | POA: Diagnosis not present

## 2014-12-31 DIAGNOSIS — B3731 Acute candidiasis of vulva and vagina: Secondary | ICD-10-CM

## 2014-12-31 DIAGNOSIS — B373 Candidiasis of vulva and vagina: Secondary | ICD-10-CM

## 2014-12-31 LAB — URINALYSIS W MICROSCOPIC + REFLEX CULTURE
BILIRUBIN URINE: NEGATIVE
CASTS: NONE SEEN
CRYSTALS: NONE SEEN
Glucose, UA: NEGATIVE mg/dL
Ketones, ur: NEGATIVE mg/dL
LEUKOCYTES UA: NEGATIVE
NITRITE: NEGATIVE
PH: 7.5 (ref 5.0–8.0)
Protein, ur: NEGATIVE mg/dL
Specific Gravity, Urine: 1.022 (ref 1.005–1.030)
UROBILINOGEN UA: 0.2 mg/dL (ref 0.0–1.0)

## 2014-12-31 LAB — CBC WITH DIFFERENTIAL/PLATELET
BASOS ABS: 0 10*3/uL (ref 0.0–0.1)
Basophils Relative: 0 % (ref 0–1)
EOS ABS: 0.2 10*3/uL (ref 0.0–0.7)
Eosinophils Relative: 3 % (ref 0–5)
HEMATOCRIT: 41.3 % (ref 36.0–46.0)
Hemoglobin: 13.9 g/dL (ref 12.0–15.0)
Lymphocytes Relative: 32 % (ref 12–46)
Lymphs Abs: 2.2 10*3/uL (ref 0.7–4.0)
MCH: 30 pg (ref 26.0–34.0)
MCHC: 33.7 g/dL (ref 30.0–36.0)
MCV: 89 fL (ref 78.0–100.0)
MONO ABS: 0.7 10*3/uL (ref 0.1–1.0)
MONOS PCT: 10 % (ref 3–12)
MPV: 9.8 fL (ref 8.6–12.4)
NEUTROS ABS: 3.8 10*3/uL (ref 1.7–7.7)
Neutrophils Relative %: 55 % (ref 43–77)
Platelets: 325 10*3/uL (ref 150–400)
RBC: 4.64 MIL/uL (ref 3.87–5.11)
RDW: 12.9 % (ref 11.5–15.5)
WBC: 6.9 10*3/uL (ref 4.0–10.5)

## 2014-12-31 LAB — GLUCOSE, RANDOM: GLUCOSE: 84 mg/dL (ref 70–99)

## 2014-12-31 MED ORDER — TERCONAZOLE 0.4 % VA CREA: 1.0000 | TOPICAL_CREAM | Freq: Every day | VAGINAL | Status: DC

## 2014-12-31 NOTE — Patient Instructions (Signed)
Health Maintenance Adopting a healthy lifestyle and getting preventive care can go a long way to promote health and wellness. Talk with your health care provider about what schedule of regular examinations is right for you. This is a good chance for you to check in with your provider about disease prevention and staying healthy. In between checkups, there are plenty of things you can do on your own. Experts have done a lot of research about which lifestyle changes and preventive measures are most likely to keep you healthy. Ask your health care provider for more information. WEIGHT AND DIET  Eat a healthy diet  Be sure to include plenty of vegetables, fruits, low-fat dairy products, and lean protein.  Do not eat a lot of foods high in solid fats, added sugars, or salt.  Get regular exercise. This is one of the most important things you can do for your health.  Most adults should exercise for at least 150 minutes each week. The exercise should increase your heart rate and make you sweat (moderate-intensity exercise).  Most adults should also do strengthening exercises at least twice a week. This is in addition to the moderate-intensity exercise.  Maintain a healthy weight  Body mass index (BMI) is a measurement that can be used to identify possible weight problems. It estimates body fat based on height and weight. Your health care provider can help determine your BMI and help you achieve or maintain a healthy weight.  For females 25 years of age and older:   A BMI below 18.5 is considered underweight.  A BMI of 18.5 to 24.9 is normal.  A BMI of 25 to 29.9 is considered overweight.  A BMI of 30 and above is considered obese.  Watch levels of cholesterol and blood lipids  You should start having your blood tested for lipids and cholesterol at 24 years of age, then have this test every 5 years.  You may need to have your cholesterol levels checked more often if:  Your lipid or  cholesterol levels are high.  You are older than 24 years of age.  You are at high risk for heart disease.  CANCER SCREENING   Lung Cancer  Lung cancer screening is recommended for adults 97-92 years old who are at high risk for lung cancer because of a history of smoking.  A yearly low-dose CT scan of the lungs is recommended for people who:  Currently smoke.  Have quit within the past 15 years.  Have at least a 30-pack-year history of smoking. A pack year is smoking an average of one pack of cigarettes a day for 1 year.  Yearly screening should continue until it has been 15 years since you quit.  Yearly screening should stop if you develop a health problem that would prevent you from having lung cancer treatment.  Breast Cancer  Practice breast self-awareness. This means understanding how your breasts normally appear and feel.  It also means doing regular breast self-exams. Let your health care provider know about any changes, no matter how small.  If you are in your 20s or 30s, you should have a clinical breast exam (CBE) by a health care provider every 1-3 years as part of a regular health exam.  If you are 76 or older, have a CBE every year. Also consider having a breast X-ray (mammogram) every year.  If you have a family history of breast cancer, talk to your health care provider about genetic screening.  If you are  at high risk for breast cancer, talk to your health care provider about having an MRI and a mammogram every year.  Breast cancer gene (BRCA) assessment is recommended for women who have family members with BRCA-related cancers. BRCA-related cancers include:  Breast.  Ovarian.  Tubal.  Peritoneal cancers.  Results of the assessment will determine the need for genetic counseling and BRCA1 and BRCA2 testing. Cervical Cancer Routine pelvic examinations to screen for cervical cancer are no longer recommended for nonpregnant women who are considered low  risk for cancer of the pelvic organs (ovaries, uterus, and vagina) and who do not have symptoms. A pelvic examination may be necessary if you have symptoms including those associated with pelvic infections. Ask your health care provider if a screening pelvic exam is right for you.   The Pap test is the screening test for cervical cancer for women who are considered at risk.  If you had a hysterectomy for a problem that was not cancer or a condition that could lead to cancer, then you no longer need Pap tests.  If you are older than 65 years, and you have had normal Pap tests for the past 10 years, you no longer need to have Pap tests.  If you have had past treatment for cervical cancer or a condition that could lead to cancer, you need Pap tests and screening for cancer for at least 20 years after your treatment.  If you no longer get a Pap test, assess your risk factors if they change (such as having a new sexual partner). This can affect whether you should start being screened again.  Some women have medical problems that increase their chance of getting cervical cancer. If this is the case for you, your health care provider may recommend more frequent screening and Pap tests.  The human papillomavirus (HPV) test is another test that may be used for cervical cancer screening. The HPV test looks for the virus that can cause cell changes in the cervix. The cells collected during the Pap test can be tested for HPV.  The HPV test can be used to screen women 30 years of age and older. Getting tested for HPV can extend the interval between normal Pap tests from three to five years.  An HPV test also should be used to screen women of any age who have unclear Pap test results.  After 24 years of age, women should have HPV testing as often as Pap tests.  Colorectal Cancer  This type of cancer can be detected and often prevented.  Routine colorectal cancer screening usually begins at 24 years of  age and continues through 24 years of age.  Your health care provider may recommend screening at an earlier age if you have risk factors for colon cancer.  Your health care provider may also recommend using home test kits to check for hidden blood in the stool.  A small camera at the end of a tube can be used to examine your colon directly (sigmoidoscopy or colonoscopy). This is done to check for the earliest forms of colorectal cancer.  Routine screening usually begins at age 50.  Direct examination of the colon should be repeated every 5-10 years through 24 years of age. However, you may need to be screened more often if early forms of precancerous polyps or small growths are found. Skin Cancer  Check your skin from head to toe regularly.  Tell your health care provider about any new moles or changes in   moles, especially if there is a change in a mole's shape or color.  Also tell your health care provider if you have a mole that is larger than the size of a pencil eraser.  Always use sunscreen. Apply sunscreen liberally and repeatedly throughout the day.  Protect yourself by wearing long sleeves, pants, a wide-brimmed hat, and sunglasses whenever you are outside. HEART DISEASE, DIABETES, AND HIGH BLOOD PRESSURE   Have your blood pressure checked at least every 1-2 years. High blood pressure causes heart disease and increases the risk of stroke.  If you are between 75 years and 42 years old, ask your health care provider if you should take aspirin to prevent strokes.  Have regular diabetes screenings. This involves taking a blood sample to check your fasting blood sugar level.  If you are at a normal weight and have a low risk for diabetes, have this test once every three years after 25 years of age.  If you are overweight and have a high risk for diabetes, consider being tested at a younger age or more often. PREVENTING INFECTION  Hepatitis B  If you have a higher risk for  hepatitis B, you should be screened for this virus. You are considered at high risk for hepatitis B if:  You were born in a country where hepatitis B is common. Ask your health care provider which countries are considered high risk.  Your parents were born in a high-risk country, and you have not been immunized against hepatitis B (hepatitis B vaccine).  You have HIV or AIDS.  You use needles to inject street drugs.  You live with someone who has hepatitis B.  You have had sex with someone who has hepatitis B.  You get hemodialysis treatment.  You take certain medicines for conditions, including cancer, organ transplantation, and autoimmune conditions. Hepatitis C  Blood testing is recommended for:  Everyone born from [birth year] through 1965.  Anyone with known risk factors for hepatitis C. Sexually transmitted infections (STIs)  You should be screened for sexually transmitted infections (STIs) including gonorrhea and chlamydia if:  You are sexually active and are younger than 24 years of age.  You are older than 24 years of age and your health care provider tells you that you are at risk for this type of infection.  Your sexual activity has changed since you were last screened and you are at an increased risk for chlamydia or gonorrhea. Ask your health care provider if you are at risk.  If you do not have HIV, but are at risk, it may be recommended that you take a prescription medicine daily to prevent HIV infection. This is called pre-exposure prophylaxis (PrEP). You are considered at risk if:  You are sexually active and do not regularly use condoms or know the HIV status of your partner(s).  You take drugs by injection.  You are sexually active with a partner who has HIV. Talk with your health care provider about whether you are at high risk of being infected with HIV. If you choose to begin PrEP, you should first be tested for HIV. You should then be tested every 3 months for  as long as you are taking PrEP.  PREGNANCY   If you are premenopausal and you may become pregnant, ask your health care provider about preconception counseling.  If you may become pregnant, take 400 to 800 micrograms (mcg) of folic acid every day.  If you want to prevent pregnancy, talk to your  health care provider about birth control (contraception). OSTEOPOROSIS AND MENOPAUSE   Osteoporosis is a disease in which the bones lose minerals and strength with aging. This can result in serious bone fractures. Your risk for osteoporosis can be identified using a bone density scan.  If you are 65 years of age or older, or if you are at risk for osteoporosis and fractures, ask your health care provider if you should be screened.  Ask your health care provider whether you should take a calcium or vitamin D supplement to lower your risk for osteoporosis.  Menopause may have certain physical symptoms and risks.  Hormone replacement therapy may reduce some of these symptoms and risks. Talk to your health care provider about whether hormone replacement therapy is right for you.  HOME CARE INSTRUCTIONS   Schedule regular health, dental, and eye exams.  Stay current with your immunizations.   Do not use any tobacco products including cigarettes, chewing tobacco, or electronic cigarettes.  If you are pregnant, do not drink alcohol.  If you are breastfeeding, limit how much and how often you drink alcohol.  Limit alcohol intake to no more than 1 drink per day for nonpregnant women. One drink equals 12 ounces of beer, 5 ounces of wine, or 1 ounces of hard liquor.  Do not use street drugs.  Do not share needles.  Ask your health care provider for help if you need support or information about quitting drugs.  Tell your health care provider if you often feel depressed.  Tell your health care provider if you have ever been abused or do not feel safe at home. Document Released: 05/29/2011  Document Revised: 03/30/2014 Document Reviewed: 10/15/2013 ExitCare Patient Information 2015 [employer]. This information is not intended to replace advice given to you by your health care provider. Make sure you discuss any questions you have with your health care provider. Monilial Vaginitis Vaginitis in a soreness, swelling and redness (inflammation) of the vagina and vulva. Monilial vaginitis is not a sexually transmitted infection. CAUSES  Yeast vaginitis is caused by yeast (candida) that is normally found in your vagina. With a yeast infection, the candida has overgrown in number to a point that upsets the chemical balance. SYMPTOMS   White, thick vaginal discharge.  Swelling, itching, redness and irritation of the vagina and possibly the lips of the vagina (vulva).  Burning or painful urination.  Painful intercourse. DIAGNOSIS  Things that may contribute to monilial vaginitis are:  Postmenopausal and virginal states.  Pregnancy.  Infections.  Being tired, sick or stressed, especially if you had monilial vaginitis in the past.  Diabetes. Good control will help lower the chance.  Birth control pills.  Tight fitting garments.  Using bubble bath, feminine sprays, douches or deodorant tampons.  Taking certain medications that kill germs (antibiotics).  Sporadic recurrence can occur if you become ill. TREATMENT  Your caregiver will give you medication.  There are several kinds of anti monilial vaginal creams and suppositories specific for monilial vaginitis. For recurrent yeast infections, use a suppository or cream in the vagina 2 times a week, or as directed.  Anti-monilial or steroid cream for the itching or irritation of the vulva may also be used. Get your caregiver's permission.  Painting the vagina with methylene blue solution may help if the monilial cream does not work.  Eating yogurt may help prevent monilial vaginitis. HOME CARE INSTRUCTIONS   Finish  all medication as prescribed.  Do not have sex until treatment is   completed or after your caregiver tells you it is okay.  Take warm sitz baths.  Do not douche.  Do not use tampons, especially scented ones.  Wear cotton underwear.  Avoid tight pants and panty hose.  Tell your sexual partner that you have a yeast infection. They should go to their caregiver if they have symptoms such as mild rash or itching.  Your sexual partner should be treated as well if your infection is difficult to eliminate.  Practice safer sex. Use condoms.  Some vaginal medications cause latex condoms to fail. Vaginal medications that harm condoms are:  Cleocin cream.  Butoconazole (Femstat).  Terconazole (Terazol) vaginal suppository.  Miconazole (Monistat) (may be purchased over the counter). SEEK MEDICAL CARE IF:   You have a temperature by mouth above 102 F (38.9 C).  The infection is getting worse after 2 days of treatment.  The infection is not getting better after 3 days of treatment.  You develop blisters in or around your vagina.  You develop vaginal bleeding, and it is not your menstrual period.  You have pain when you urinate.  You develop intestinal problems.  You have pain with sexual intercourse. Document Released: 08/23/2005 Document Revised: 02/05/2012 Document Reviewed: 05/07/2009 ExitCare Patient Information 2015 [employer]. This information is not intended to replace advice given to you by your health care provider. Make sure you discuss any questions you have with your health care provider.  

## 2014-12-31 NOTE — Telephone Encounter (Signed)
12/31/14-[facility] Nexplanon & insertion for contraception at 100%, no copay. Patient will call first day of next cycle to schedule with TF/wl

## 2014-12-31 NOTE — Progress Notes (Signed)
Pamela CaldronOlivia Thornton 10-02-1991 [id number]    History:    Presents for annual exam.  Regular monthly cycles/same partner. Gardasil series completed. Implanon 2011 out 2014. Would like to have Nexplanon. Use pills but would forget. 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 History of headaches, better on nortriptyline per primary care.  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  ROS:  A ROS was performed and pertinent positives and negatives are included.  Exam:  Filed Vitals:   12/31/14 0907  BP: 122/80    General appearance:  Normal Thyroid:  Symmetrical, normal in size, without palpable masses or nodularity. Respiratory  Auscultation:  Clear without wheezing or rhonchi Cardiovascular  Auscultation:  Regular rate, without rubs, murmurs or gallops  Edema/varicosities:  Not grossly evident Abdominal  Soft,nontender, without masses, guarding or rebound.  Liver/spleen:  No organomegaly noted  Hernia:  None appreciated  Skin  Inspection:  Grossly normal   Breasts: Examined lying and sitting.     Right: Without masses, retractions, discharge or axillary adenopathy.     Left: Without masses, retractions, discharge or axillary adenopathy. Gentitourinary   Inguinal/mons:  Normal without inguinal adenopathy  External genitalia:  Normal  BUS/Urethra/Skene's glands:  Normal  Vagina:  Normal  Cervix:  Normal  Uterus:   normal in size, shape and contour.  Midline and mobile  Adnexa/parametria:     Rt: Without masses or tenderness.   Lt: Without masses or tenderness.  Anus and perineum: Normal  Digital rectal exam: Normal sphincter tone without palpated masses or tenderness  Assessment/Plan:  24 y.o. S WF G0 for annual exam.    Contraception management Obesity 2015 Pap LGSIL  Plan: Pap, screening guidelines reviewed. Contraception options reviewed, Nexplanon reviewed did well with in the  past will schedule appointment with Dr. Audie BoxFontaine to place with next cycle. Will check coverage. Encouraged condoms or abstinence until placed. SBE's, regular exercise, calcium rich diet, MVI daily and decrease calories for weight loss encouraged. CBC, glucose, UA,   YOUNG,NANCY J WHNP, 11:10 AM 12/31/2014

## 2015-01-01 LAB — CYTOLOGY - PAP

## 2015-01-01 LAB — URINE CULTURE: Colony Count: 80000

## 2015-01-11 ENCOUNTER — Ambulatory Visit: Payer: 59 | Admitting: Nurse Practitioner

## 2015-01-12 ENCOUNTER — Telehealth: Payer: Self-pay

## 2015-01-12 NOTE — Telephone Encounter (Signed)
Called patient and left her a voice mail and a MY Chart request to reschedule her apt. 01-11-15 with Eber Jonesarolyn CM/ Anne HahnWillis. If Eber JonesCarolyn does not have opening's patient can go on Dr.Willis Schedule Thanks Annabelle HarmanDana.

## 2015-01-28 ENCOUNTER — Ambulatory Visit: Payer: 59 | Admitting: Gynecology

## 2015-02-02 ENCOUNTER — Encounter: Payer: Self-pay | Admitting: Women's Health

## 2015-02-03 ENCOUNTER — Encounter: Payer: Self-pay | Admitting: Nurse Practitioner

## 2015-02-03 ENCOUNTER — Ambulatory Visit (INDEPENDENT_AMBULATORY_CARE_PROVIDER_SITE_OTHER): Payer: 59 | Admitting: Nurse Practitioner

## 2015-02-03 ENCOUNTER — Encounter: Payer: Self-pay | Admitting: Women's Health

## 2015-02-03 VITALS — BP 116/72 | HR 93 | Ht 64.5 in | Wt 183.2 lb

## 2015-02-03 DIAGNOSIS — R51 Headache: Secondary | ICD-10-CM

## 2015-02-03 DIAGNOSIS — R519 Headache, unspecified: Secondary | ICD-10-CM

## 2015-02-03 MED ORDER — NORTRIPTYLINE HCL 10 MG PO CAPS: 20.0000 mg | ORAL_CAPSULE | Freq: Every day | ORAL | Status: DC

## 2015-02-03 NOTE — Progress Notes (Signed)
I have read the note, and I agree with the clinical assessment and plan.  WILLIS,CHARLES KEITH   

## 2015-02-03 NOTE — Progress Notes (Signed)
[facility]  PATIENT: Pamela Thornton DOB: 08/16/1991   REASON FOR VISIT: Follow-up for migraine HISTORY FROM: patient     HISTORY OF PRESENT ILLNESS:Pamela Thornton, 24 -year-old female returns for followup. She was last seen in this office 07/24/14.  She is currently on nortriptyline  at night with good control of her headaches. She has failed Relpax and Maxalt and most recently Imitrex as acute medications which caused nausea.  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 She denies any weakness, numbness of the face arms or legs. She occasionally has photophobia and phonophobia with severe headaches she will occasionally be nauseated. She has cut out her fast food since last seen and is eating a healthy diet. She is having maybe one  headache at the most monthly.  She is taking ibuprofen when she has a headache since she has failed 3 triptans.  She returns for reevaluation.  She needs refills on her medication  HISTORY:headaches throughout most of her life. The patient indicates that she recalls her headaches getting more severe when she was 19 or 24 years old. Over the last 3 years, the patient has not had a headache free day. The patient indicates the headaches are in the front and back of the head associated with a pressure sensation, occasionally a shooting pain. The headache severity may vary day to day, and she usually does not take anything for her headache. The patient does not miss work because of the headache. When the headache gets severe, she may take Aleve or Advil. The patient may have some nausea, no vomiting, and some blurred vision with the headache. The patient denies neck stiffness. The patient denies numbness or weakness of the face, arms, or legs. The patient denies any problems with balance, although she occasionally will have some dizziness with the headache. The patient denies problems controlling the bowels  or the bladder. The patient may have some photophobia and phonophobia when headache is very severe. The patient does not recall any particular activating factors for the headache. The patient does not have allergy symptoms or sinus drainage    REVIEW OF SYSTEMS: Full 14 system review of systems performed and notable only for those listed, all others are neg:  Constitional Fatigue  Cardiovascular: neg Ear/Nose/Throat: neg  Skin: neg Eyes: neg Respiratory:Wheezing Gastroitestinal: neg  Hematology/Lymphatic: neg  Endocrine: neg Musculoskeletal: Joint pain back pain aching muscles  Allergy/Immunology: neg NeurologicHeadache  Psychiatric: Agitation depression Sleep : neg   ALLERGIES: No Known Allergies  HOME MEDICATIONS: Outpatient Prescriptions Prior to Visit  Medication Sig Dispense Refill  . nortriptyline (PAMELOR) 10 MG capsule Take 2 capsules (20 mg total) by mouth at bedtime. 60 capsule 6  . terconazole (TERAZOL 7) 0.4 % vaginal cream Place 1 applicator vaginally at bedtime. 45 g 1  . ibuprofen (ADVIL,MOTRIN) 200 MG tablet Take 400 mg by mouth every 6 (six) hours as needed for pain.    . SUMAtriptan (IMITREX) 100 MG tablet Take 1 tablet (100 mg total) by mouth every 2 (two) hours as needed for migraine or headache. May repeat in 2 hours if headache persists or recurs. (Patient not taking: Reported on 02/03/2015) 10 tablet 6   No facility-administered medications prior to visit.    PAST MEDICAL HISTORY: Past Medical History  Diagnosis Date  . Speech impediment   . Headache(784.0) 08/07/2013  . Obesity     PAST SURGICAL HISTORY: Past Surgical History  Procedure  Laterality Date  . Implanon insertion  sometime between 2/12-4/12    inserted not by [facility]    FAMILY HISTORY: Family History  Problem Relation Age of Onset  . Cancer Mother     Tonsil cancer  . Diabetes Father     type 2  . Migraines Father     SOCIAL HISTORY: History   Social History  . Marital  Status: Single    Spouse Name: N/A  . Number of Children: 0  . Years of Education: 14   Occupational History  .  Other   Social History Main Topics  . Smoking status: Former Smoker    Types: Cigarettes    Start date: 08/08/2011  . Smokeless tobacco: Never Used  . Alcohol Use: 0.0 oz/week    0 Standard drinks or equivalent per week     Comment: rarely  . Drug Use: No  . Sexual Activity: Yes     Comment: INTERCOURSE AGE 85, SEXUAL PARTNERS MORE THAN 5   Other Topics Concern  . Not on file   Social History Narrative   Patient is single and lives with her parents.   Patient is working full-time.   Patient has an Associates Degree.   Patient is right-handed.   Patient drinks 2-3 cups of coffee daily, and 1 soda daily.     PHYSICAL EXAM  Filed Vitals:   02/03/15 0859  BP: 116/72  Pulse: 93  Height: 5' 4.5" (1.638 m)  Weight: 183 lb 3.2 oz (83.099 kg)   Body mass index is 30.97 kg/(m^2).  Generalized: Well developed, mildly obese female in no acute distress  Head: normocephalic and atraumatic,. Oropharynx benign  Neck: Supple, no carotid bruits  Musculoskeletal: No deformity   Neurological examination   Mentation: Alert oriented to time, place, history taking. Attention span and concentration appropriate. Recent and remote memory intact.  Follows all commands speech and language fluent.   Cranial nerve II-XII: Pupils were equal round reactive to light extraocular movements were full, visual field were full on confrontational test. Facial sensation and strength were normal. hearing was intact to finger rubbing bilaterally. Uvula tongue midline. head turning and shoulder shrug were normal and symmetric.Tongue protrusion into cheek strength was normal. Motor: normal bulk and tone, full strength in the BUE, BLE, fine finger movements normal, no pronator drift. No focal weakness Coordination: finger-nose-finger, heel-to-shin bilaterally, no dysmetria Reflexes:  Brachioradialis 2/2, biceps 2/2, triceps 2/2, patellar 2/2, Achilles 2/2, plantar responses were flexor bilaterally. Gait and Station: Rising up from seated position without assistance, normal stance,  moderate stride, good arm swing, smooth turning, able to perform tiptoe, and heel walking without difficulty. Tandem gait is steady  DIAGNOSTIC DATA (LABS, IMAGING, TESTING) - I reviewed patient records, labs, notes, testing and imaging myself where available.  Lab Results  Component Value Date   WBC 6.9 12/31/2014   HGB 13.9 12/31/2014   HCT 41.3 12/31/2014   MCV 89.0 12/31/2014   PLT 325 12/31/2014      Component Value Date/Time   GLUCOSE 84 12/31/2014 0932   ASSESSMENT AND PLAN  24 y.o. year old female  has a past medical history of Speech impediment; [id number](478.2Headache(784.0) (08/07/2013); and Obesity. here to follow-up for migraine. She is currently on nortriptyline 20 mg at bedtime. She has failed Maxalt and Relpax and Imitrex. She is currently using ibuprofen acutely with relief. She is having approximately 1 migraine per month.  Continue Nortriptyline at current dose will refill for 1 year Call for increase  in headaches F/U yearly Nilda Riggs, [facility], [facility], APRN  [facility] [address], [address] [place], [place] [address] [phone]

## 2015-02-03 NOTE — Patient Instructions (Signed)
Continue Nortriptyline at current dose will refill for 1 year Call for increase in headaches F/U yearly

## 2015-03-16 ENCOUNTER — Encounter: Payer: Self-pay | Admitting: Gynecology

## 2015-03-16 ENCOUNTER — Ambulatory Visit (INDEPENDENT_AMBULATORY_CARE_PROVIDER_SITE_OTHER): Payer: 59 | Admitting: Gynecology

## 2015-03-16 VITALS — BP 116/74

## 2015-03-16 DIAGNOSIS — Z308 Encounter for other contraceptive management: Secondary | ICD-10-CM | POA: Diagnosis not present

## 2015-03-16 DIAGNOSIS — Z30017 Encounter for initial prescription of implantable subdermal contraceptive: Secondary | ICD-10-CM

## 2015-03-16 MED ORDER — ETONOGESTREL 68 MG ~~LOC~~ IMPL: 68.0000 mg | DRUG_IMPLANT | Freq: Once | SUBCUTANEOUS | Status: DC

## 2015-03-16 NOTE — Patient Instructions (Signed)
Etonogestrel implant What is this medicine? ETONOGESTREL (et oh noe JES trel) is a contraceptive (birth control) device. It is used to prevent pregnancy. It can be used for up to 3 years. This medicine may be used for other purposes; ask your health care provider or pharmacist if you have questions. COMMON BRAND NAME(S): Implanon, Nexplanon What should I tell my health care provider before I take this medicine? They need to know if you have any of these conditions: -abnormal vaginal bleeding -blood vessel disease or blood clots -cancer of the breast, cervix, or liver -depression -diabetes -gallbladder disease -headaches -heart disease or recent heart attack -high blood pressure -high cholesterol -kidney disease -liver disease -renal disease -seizures -tobacco smoker -an unusual or allergic reaction to etonogestrel, other hormones, anesthetics or antiseptics, medicines, foods, dyes, or preservatives -pregnant or trying to get pregnant -breast-feeding How should I use this medicine? This device is inserted just under the skin on the inner side of your upper arm by a health care professional. Talk to your pediatrician regarding the use of this medicine in children. Special care may be needed. Overdosage: If you think you've taken too much of this medicine contact a poison control center or emergency room at once. Overdosage: If you think you have taken too much of this medicine contact a poison control center or emergency room at once. NOTE: This medicine is only for you. Do not share this medicine with others. What if I miss a dose? This does not apply. What may interact with this medicine? Do not take this medicine with any of the following medications: -amprenavir -bosentan -fosamprenavir This medicine may also interact with the following medications: -barbiturate medicines for inducing sleep or treating seizures -certain medicines for fungal infections like ketoconazole and  itraconazole -griseofulvin -medicines to treat seizures like carbamazepine, felbamate, oxcarbazepine, phenytoin, topiramate -modafinil -phenylbutazone -rifampin -some medicines to treat HIV infection like atazanavir, indinavir, lopinavir, nelfinavir, tipranavir, ritonavir -St. John's wort This list may not describe all possible interactions. Give your health care provider a list of all the medicines, herbs, non-prescription drugs, or dietary supplements you use. Also tell them if you smoke, drink alcohol, or use illegal drugs. Some items may interact with your medicine. What should I watch for while using this medicine? This product does not protect you against HIV infection (AIDS) or other sexually transmitted diseases. You should be able to feel the implant by pressing your fingertips over the skin where it was inserted. Tell your doctor if you cannot feel the implant. What side effects may I notice from receiving this medicine? Side effects that you should report to your doctor or health care professional as soon as possible: -allergic reactions like skin rash, itching or hives, swelling of the face, lips, or tongue -breast lumps -changes in vision -confusion, trouble speaking or understanding -dark urine -depressed mood -general ill feeling or flu-like symptoms -light-colored stools -loss of appetite, nausea -right upper belly pain -severe headaches -severe pain, swelling, or tenderness in the abdomen -shortness of breath, chest pain, swelling in a leg -signs of pregnancy -sudden numbness or weakness of the face, arm or leg -trouble walking, dizziness, loss of balance or coordination -unusual vaginal bleeding, discharge -unusually weak or tired -yellowing of the eyes or skin Side effects that usually do not require medical attention (Report these to your doctor or health care professional if they continue or are bothersome.): -acne -breast pain -changes in  weight -cough -fever or chills -headache -irregular menstrual bleeding -itching, burning, and   vaginal discharge -pain or difficulty passing urine -sore throat This list may not describe all possible side effects. Call your doctor for medical advice about side effects. You may report side effects to FDA at 1-800-FDA-1088. Where should I keep my medicine? This drug is given in a hospital or clinic and will not be stored at home. NOTE: This sheet is a summary. It may not cover all possible information. If you have questions about this medicine, talk to your doctor, pharmacist, or health care provider.  2015, Elsevier/Gold Standard. (2012-05-20 15:37:45)  

## 2015-03-16 NOTE — Progress Notes (Signed)
Patient presents for Nexplanon insertion. She previously has been counseled for her contraceptive options and she elects for nexplanon. She did have one previously.  I reviewed the Nexplanon insertional process and the side effects/risks. I reviewed irregular bleeding, insertion site infections, underlying neurovascular damage with permanent sequela, migration of the implant making removal difficult requiring surgery, the need to have it removed in 3 years under a separate procedure and lastly the risk of failure with pregnancy. Patient is currently on a normal menses and she is right-handed. She has read through and signed the consent form.  Procedure with Selena BattenKim assistant: Left upper arm examined and marked according to manufacturer's recommendation. The insertion site was cleansed with Betadine solution and the insertional tract infiltrated with 1% lidocaine. The Nexplanon was placed according to manufacturer's recommendation without difficulty. The skin defect was closed with a Steri-Strip. The patient palpated the rod. A pressure dressing was applied and postoperative instructions give her.   Lot number:  [id number]     Dara LordsFONTAINE,Emira Eubanks P MD, 1:40 PM 03/16/2015

## 2015-03-17 ENCOUNTER — Encounter: Payer: Self-pay | Admitting: Gynecology

## 2015-05-25 ENCOUNTER — Encounter: Payer: Self-pay | Admitting: Women's Health

## 2015-05-25 ENCOUNTER — Encounter: Payer: Self-pay | Admitting: Gynecology

## 2015-06-11 ENCOUNTER — Other Ambulatory Visit: Payer: Self-pay | Admitting: Nurse Practitioner

## 2015-10-12 ENCOUNTER — Encounter: Payer: Self-pay | Admitting: Women's Health

## 2015-10-19 ENCOUNTER — Encounter: Payer: Self-pay | Admitting: Women's Health

## 2015-10-19 ENCOUNTER — Ambulatory Visit (INDEPENDENT_AMBULATORY_CARE_PROVIDER_SITE_OTHER): Payer: 59 | Admitting: Women's Health

## 2015-10-19 VITALS — BP 126/80 | Ht 64.0 in | Wt 183.0 lb

## 2015-10-19 DIAGNOSIS — B3731 Acute candidiasis of vulva and vagina: Secondary | ICD-10-CM

## 2015-10-19 DIAGNOSIS — R350 Frequency of micturition: Secondary | ICD-10-CM

## 2015-10-19 DIAGNOSIS — B373 Candidiasis of vulva and vagina: Secondary | ICD-10-CM | POA: Diagnosis not present

## 2015-10-19 LAB — URINALYSIS W MICROSCOPIC + REFLEX CULTURE
BILIRUBIN URINE: NEGATIVE
CASTS: NONE SEEN [LPF]
CRYSTALS: NONE SEEN [HPF]
Glucose, UA: NEGATIVE
Hgb urine dipstick: NEGATIVE
KETONES UR: NEGATIVE
Leukocytes, UA: NEGATIVE
Nitrite: NEGATIVE
Protein, ur: NEGATIVE
RBC / HPF: NONE SEEN RBC/HPF (ref <=2)
Specific Gravity, Urine: 1.025 (ref 1.001–1.035)
WBC, UA: NONE SEEN WBC/HPF (ref <=5)
Yeast: NONE SEEN [HPF]
pH: 6 (ref 5.0–8.0)

## 2015-10-19 LAB — WET PREP FOR TRICH, YEAST, CLUE
CLUE CELLS WET PREP: NONE SEEN
Trich, Wet Prep: NONE SEEN
Yeast Wet Prep HPF POC: NONE SEEN

## 2015-10-19 MED ORDER — NYSTATIN-TRIAMCINOLONE 100000-0.1 UNIT/GM-% EX OINT: 1.0000 "application " | TOPICAL_OINTMENT | Freq: Two times a day (BID) | CUTANEOUS | Status: DC

## 2015-10-19 MED ORDER — FLUCONAZOLE 150 MG PO TABS: 150.0000 mg | ORAL_TABLET | Freq: Once | ORAL | Status: DC

## 2015-10-19 NOTE — Progress Notes (Signed)
Patient ID: Pamela Thornton, female   DOB: 06-17-1991, 24 y.o.   MRN: [id number] Presents with complaint of increased vaginal discharge with itching and external irritation for several weeks. Same partner. Denies urinary symptoms, abdominal pain or fever. Nexplanon with rare bleeding.   Exam: Appears well. External genitalia extremely erythematous at mons pubis, mild erythema at introitus, speculum exam moderate amount of a white discharge noted, wet prep negative. Vaginal walls also erythematous. Bimanual no CMT or adnexal tenderness.  Clinical yeast  Plan: Diflucan 150 by mouth today, repeat in 3 days if needed. Mycolog-small amt to mons pubis after showers. Loose clothing, call if no relief of symptoms. Yeast prevention discussed.

## 2015-10-19 NOTE — Patient Instructions (Signed)

## 2015-10-21 LAB — URINE CULTURE
COLONY COUNT: NO GROWTH
ORGANISM ID, BACTERIA: NO GROWTH

## 2016-01-31 ENCOUNTER — Ambulatory Visit (INDEPENDENT_AMBULATORY_CARE_PROVIDER_SITE_OTHER): Payer: 59 | Admitting: Nurse Practitioner

## 2016-01-31 ENCOUNTER — Encounter: Payer: Self-pay | Admitting: Nurse Practitioner

## 2016-01-31 VITALS — BP 118/76 | HR 88 | Ht 64.0 in | Wt 186.6 lb

## 2016-01-31 DIAGNOSIS — R51 Headache: Secondary | ICD-10-CM

## 2016-01-31 DIAGNOSIS — R519 Headache, unspecified: Secondary | ICD-10-CM

## 2016-01-31 MED ORDER — NORTRIPTYLINE HCL 10 MG PO CAPS: ORAL_CAPSULE | ORAL | Status: DC

## 2016-01-31 NOTE — Progress Notes (Signed)
I have read the note, and I agree with the clinical assessment and plan.  Izak Anding KEITH   

## 2016-01-31 NOTE — Patient Instructions (Signed)
Increase  Nortriptyline to 30mg  daily will refill for 1 year Call for increase in headaches F/U yearly

## 2016-01-31 NOTE — Progress Notes (Signed)
[facility]  PATIENT: Pamela CaldronOlivia Thornton DOB: 1991/07/04   REASON FOR VISIT: Follow-up for episodic headache HISTORY FROM: Patient    HISTORY OF PRESENT ILLNESS:Pamela Thornton, 25 -year-old female returns for followup. She was last seen in this office 02/03/15.  She is currently on nortriptyline 20mg  at night with good control of her headaches, she will have 1-2 headaches per month that can last several days. She has failed Relpax and Maxalt and most recently Imitrex as acute medications which caused nausea. 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 She denies any weakness, numbness of the face arms or legs. She occasionally has photophobia and phonophobia with severe headaches she will occasionally be nauseated. She has cut out her fast food, and is eating a healthy diet.  She is taking ibuprofen when she has a headache since she has failed 3 triptans. She returns for reevaluation. She needs refills on her medication  HISTORY:headaches throughout most of her life. The patient indicates that she recalls her headaches getting more severe when she was [age] or 25 years old. Over the last 3 years, the patient has not had a headache free day. The patient indicates the headaches are in the front and back of the head associated with a pressure sensation, occasionally a shooting pain. The headache severity may vary day to day, and she usually does not take anything for her headache. The patient does not miss work because of the headache. When the headache gets severe, she may take Aleve or Advil. The patient may have some nausea, no vomiting, and some blurred vision with the headache. The patient denies neck stiffness. The patient denies numbness or weakness of the face, arms, or legs. The patient denies any problems with balance, although she occasionally will have some dizziness with the headache. The patient denies problems controlling the  bowels or the bladder. The patient may have some photophobia and phonophobia when headache is very severe. The patient does not recall any particular activating factors for the headache. The patient does not have allergy symptoms or sinus drainage    REVIEW OF SYSTEMS: Full 14 system review of systems performed and notable only for those listed, all others are neg:  Constitutional: neg  Cardiovascular: neg Ear/Nose/Throat: neg  Skin: neg Eyes: neg Respiratory: neg Gastroitestinal: neg  Hematology/Lymphatic: neg  Endocrine: neg Musculoskeletal:neg Allergy/Immunology: neg Neurological: neg Psychiatric: neg Sleep : neg   ALLERGIES: No Known Allergies  HOME MEDICATIONS: Outpatient Prescriptions Prior to Visit  Medication Sig Dispense Refill  . ibuprofen (ADVIL,MOTRIN) 200 MG tablet Take 400 mg by mouth every 6 (six) hours as needed for pain.    . nortriptyline (PAMELOR) 10 MG capsule TAKE 2 CAPSULES (20 MG TOTAL) BY MOUTH AT BEDTIME. 60 capsule 6  . fluconazole (DIFLUCAN) 150 MG tablet Take 1 tablet (150 mg total) by mouth once. Repeat in 3 days (Patient not taking: Reported on 01/31/2016) 2 tablet 1  . nystatin-triamcinolone ointment (MYCOLOG) Apply 1 application topically 2 (two) times daily. (Patient not taking: Reported on 01/31/2016) 30 g 0   Facility-Administered Medications Prior to Visit  Medication Dose Route Frequency Provider Last Rate Last Dose  . etonogestrel (NEXPLANON) implant 68 mg  68 mg Subdermal Once Dara Lordsimothy P Fontaine, MD        PAST MEDICAL HISTORY: Past Medical History  Diagnosis Date  . Speech impediment   . Obesity     PAST SURGICAL HISTORY: Past Surgical History  Procedure Laterality Date  . Implanon insertion  sometime between 2/12-4/12    inserted not by [facility]  . Nexplanon  03/16/2015    FAMILY HISTORY: Family History  Problem Relation Age of Onset  . Cancer Mother     Tonsil cancer  . Diabetes Father     type 2  . Migraines Father      SOCIAL HISTORY: Social History   Social History  . Marital Status: Single    Spouse Name: N/A  . Number of Children: 0  . Years of Education: 14   Occupational History  .  Other   Social History Main Topics  . Smoking status: Former Smoker    Types: Cigarettes    Start date: 08/08/2011  . Smokeless tobacco: Never Used  . Alcohol Use: 0.0 oz/week    0 Standard drinks or equivalent per week     Comment: rarely  . Drug Use: No  . Sexual Activity: Yes    Birth Control/ Protection: Condom, Implant     Comment: INTERCOURSE AGE 70, SEXUAL PARTNERS MORE THAN 5  Nexplanon 03/16/2015   Other Topics Concern  . Not on file   Social History Narrative   Patient is single and lives with her parents.   Patient is working full-time.   Patient has an Associates Degree.   Patient is right-handed.   Patient drinks 2-3 cups of coffee daily, and 1 soda daily.     PHYSICAL EXAM  Filed Vitals:   01/31/16 0905  BP: 118/76  Pulse: 88  Height:  (1.626 m)  Weight: 186 lb 9.6 oz (84.641 kg)   Body mass index is 32.01 kg/(m^2). Generalized: Well developed, mildly obese female in no acute distress  Head: normocephalic and atraumatic,. Oropharynx benign  Neck: Supple, no carotid bruits  Musculoskeletal: No deformity   Neurological examination   Mentation: Alert oriented to time, place, history taking. Attention span and concentration appropriate. Recent and remote memory intact. Follows all commands speech and language fluent.   Cranial nerve II-XII: Pupils were equal round reactive to light extraocular movements were full, visual field were full on confrontational test. Facial sensation and strength were normal. hearing was intact to finger rubbing bilaterally. Uvula tongue midline. head turning and shoulder shrug were normal and symmetric.Tongue protrusion into cheek strength was normal. Motor: normal bulk and tone, full strength in the BUE, BLE, fine finger movements  normal, no pronator drift. No focal weakness Coordination: finger-nose-finger, heel-to-shin bilaterally, no dysmetria Reflexes: Brachioradialis 2/2, biceps 2/2, triceps 2/2, patellar 2/2, Achilles 2/2, plantar responses were flexor bilaterally. Gait and Station: Rising up from seated position without assistance, normal stance, moderate stride, good arm swing, smooth turning, able to perform tiptoe, and heel walking without difficulty. Tandem gait is steady   DIAGNOSTIC DATA (LABS, IMAGING, TESTING)    ASSESSMENT AND PLAN 25 y.o. year old female has a past medical history of Speech impediment; ZOXWRUEA(540.9) (08/07/2013); and Obesity. here to follow-up for migraine. She is currently on nortriptyline 20 mg at bedtime. She has failed Maxalt and Relpax and Imitrex. She is currently using ibuprofen acutely with relief. She is having approximately 1 to 2 migraine per month which may last 2 to 3 days.   Increase  Nortriptyline to  at HS,  will refill for 1 year Call for increase in headaches Discussed keeping a headache diary, avoidance of migraine triggers, regular exercise and stress release F/U yearly Nilda Riggs, [facility], [facility], APRN  [facility] [address],  [place], [place] [address] [phone]

## 2016-07-20 ENCOUNTER — Emergency Department (HOSPITAL_COMMUNITY)
Admission: EM | Admit: 2016-07-20 | Discharge: 2016-07-20 | Disposition: A | Discharge status: Home / Self Care | Payer: 59 | Attending: Emergency Medicine | Admitting: Emergency Medicine

## 2016-07-20 ENCOUNTER — Encounter: Payer: Self-pay | Admitting: Nurse Practitioner

## 2016-07-20 ENCOUNTER — Encounter (HOSPITAL_COMMUNITY): Payer: Self-pay | Admitting: Emergency Medicine

## 2016-07-20 DIAGNOSIS — G43009 Migraine without aura, not intractable, without status migrainosus: Secondary | ICD-10-CM

## 2016-07-20 DIAGNOSIS — G43909 Migraine, unspecified, not intractable, without status migrainosus: Secondary | ICD-10-CM | POA: Diagnosis not present

## 2016-07-20 DIAGNOSIS — Z87891 Personal history of nicotine dependence: Secondary | ICD-10-CM | POA: Diagnosis not present

## 2016-07-20 MED ORDER — DIPHENHYDRAMINE HCL 50 MG/ML IJ SOLN
25.0000 mg | Freq: Once | INTRAMUSCULAR | Status: AC
  Administered 2016-07-20: 25 mg via INTRAVENOUS
  Filled 2016-07-20: qty 1

## 2016-07-20 MED ORDER — KETOROLAC TROMETHAMINE 30 MG/ML IJ SOLN
30.0000 mg | Freq: Once | INTRAMUSCULAR | Status: AC
  Administered 2016-07-20: 30 mg via INTRAVENOUS
  Filled 2016-07-20: qty 1

## 2016-07-20 MED ORDER — SODIUM CHLORIDE 0.9 % IV BOLUS (SEPSIS)
1000.0000 mL | Freq: Once | INTRAVENOUS | Status: AC
  Administered 2016-07-20: 1000 mL via INTRAVENOUS

## 2016-07-20 MED ORDER — ONDANSETRON 4 MG PO TBDP: ORAL_TABLET | ORAL | 0 refills | Status: DC

## 2016-07-20 MED ORDER — METOCLOPRAMIDE HCL 5 MG/ML IJ SOLN
10.0000 mg | Freq: Once | INTRAMUSCULAR | Status: AC
  Administered 2016-07-20: 10 mg via INTRAVENOUS
  Filled 2016-07-20: qty 2

## 2016-07-20 NOTE — ED Triage Notes (Signed)
Pt here with migraine since this morning. Pt sts she is unable to keep anything down. Pt denies neuro symptoms.

## 2016-07-20 NOTE — ED Provider Notes (Signed)
[facility]-EMERGENCY DEPT Provider Note   CSN: [id number] Arrival date & time: 07/20/16  1831     History   Chief Complaint Chief Complaint  Patient presents with  . Migraine    HPI Pamela Thornton is a 25 y.o. female.  Patient presents to ED for evaluation of migraine.  Patient states her headache started around 0830 this morning. Headache is in typical location for patient, but her nausea has been worse today.  She has attempted to take her medications (X3), but has thrown up immediately after each time.   The history is provided by the patient.  Migraine  This is a recurrent problem. The current episode started 6 to 12 hours ago. The problem occurs daily. The problem has been gradually worsening. Associated symptoms include headaches. Pertinent negatives include no abdominal pain and no shortness of breath. The symptoms are aggravated by exertion. Nothing relieves the symptoms. The treatment provided no relief.    Past Medical History:  Diagnosis Date  . Obesity   . Speech impediment     Patient Active Problem List   Diagnosis Date Noted  . Headache 08/07/2013  . Implanon removed 12/2012 09/16/2012    Past Surgical History:  Procedure Laterality Date  . implanon insertion  sometime between 2/12-4/12   inserted not by [facility]  . Nexplanon  03/16/2015    OB History    Gravida Para Term Preterm AB Living   0             SAB TAB Ectopic Multiple Live Births                   Home Medications    Prior to Admission medications   Medication Sig Start Date End Date Taking? Authorizing Provider  ibuprofen (ADVIL,MOTRIN) 200 MG tablet Take 400 mg by mouth every 6 (six) hours as needed for pain.    Historical Provider, MD  nortriptyline (PAMELOR) 10 MG capsule TAKE 3 CAPSULES (30 MG TOTAL) BY MOUTH AT BEDTIME. 01/31/16   Nilda RiggsNancy Carolyn Martin, NP    Family History Family History  Problem Relation Age of Onset  . Cancer Mother     Tonsil cancer  . Diabetes Father     type 2    . Migraines Father     Social History Social History  Substance Use Topics  . Smoking status: Former Smoker    Types: Cigarettes    Start date: 08/08/2011  . Smokeless tobacco: Never Used  . Alcohol use 0.0 oz/week     Comment: rarely     Allergies   Review of patient's allergies indicates no known allergies.   Review of Systems Review of Systems  Eyes: Negative for photophobia and visual disturbance.  Respiratory: Negative for shortness of breath.   Gastrointestinal: Negative for abdominal pain.  Neurological: Positive for headaches.  All other systems reviewed and are negative.    Physical Exam Updated Vital Signs BP 135/93   Pulse 95   Temp 97.9 F (36.6 C)   Resp 18   Wt 90.3 kg   SpO2 96%   BMI 34.17 kg/m   Physical Exam  Constitutional: She is oriented to person, place, and time. She appears well-developed and well-nourished.  HENT:  Head: Normocephalic and atraumatic.  Eyes: EOM are normal. Pupils are equal, round, and reactive to light.  Neck: Neck supple.  Cardiovascular: Normal rate and regular rhythm.   Pulmonary/Chest: Effort normal and breath sounds normal.  Abdominal: Soft. Bowel sounds are normal.  Musculoskeletal: She exhibits no edema.  Lymphadenopathy:    She has no cervical adenopathy.  Neurological: She is alert and oriented to person, place, and time.  Skin: Skin is warm and dry.  Psychiatric: She has a normal mood and affect.  Nursing note and vitals reviewed.    ED Treatments / Results  Labs (all labs ordered are listed, but only abnormal results are displayed) Labs Reviewed - No data to display  EKG  EKG Interpretation None       Radiology No results found.  Procedures Procedures (including critical care time)  Medications Ordered in ED Medications - No data to display   Initial Impression / Assessment and Plan / ED Course  I have reviewed the triage vital signs and the nursing notes.  Pertinent labs &  imaging results that were available during my care of the patient were reviewed by me and considered in my medical decision making (see chart for details).  Clinical Course  Pt HA treated and improved while in ED.  Presentation is like pts typical HA and non concerning for [facility], ICH, Meningitis, or temporal arteritis. Pt is afebrile with no focal neuro deficits, nuchal rigidity, or change in vision. Pt is to follow up with PCP/neurologist to discuss prophylactic medication. Pt verbalizes understanding and is agreeable with plan to dc.      Final Clinical Impressions(s) / ED Diagnoses   Final diagnoses:  None    New Prescriptions New Prescriptions   No medications on file     Felicie Mornavid Belynda Pagaduan, NP 07/20/16 2219    Vanetta MuldersScott Zackowski, MD 07/26/16 [phone]

## 2016-09-12 ENCOUNTER — Encounter: Payer: Self-pay | Admitting: Nurse Practitioner

## 2017-01-30 ENCOUNTER — Ambulatory Visit: Payer: 59 | Admitting: Nurse Practitioner

## 2017-01-30 ENCOUNTER — Telehealth: Payer: Self-pay

## 2017-01-30 NOTE — Telephone Encounter (Signed)
Pt no-showed her follow-up appt this am. 

## 2017-01-31 ENCOUNTER — Encounter: Payer: Self-pay | Admitting: Nurse Practitioner

## 2017-02-14 ENCOUNTER — Other Ambulatory Visit: Payer: Self-pay | Admitting: Nurse Practitioner

## 2017-03-02 ENCOUNTER — Other Ambulatory Visit: Payer: Self-pay | Admitting: Nurse Practitioner

## 2017-10-05 ENCOUNTER — Emergency Department (HOSPITAL_COMMUNITY): Payer: 59

## 2017-10-05 ENCOUNTER — Encounter (HOSPITAL_COMMUNITY): Payer: Self-pay | Admitting: Emergency Medicine

## 2017-10-05 ENCOUNTER — Emergency Department (HOSPITAL_COMMUNITY)
Admission: EM | Admit: 2017-10-05 | Discharge: 2017-10-06 | Disposition: A | Discharge status: Home / Self Care | Payer: 59 | Attending: Emergency Medicine | Admitting: Emergency Medicine

## 2017-10-05 ENCOUNTER — Other Ambulatory Visit: Payer: Self-pay

## 2017-10-05 DIAGNOSIS — R0789 Other chest pain: Secondary | ICD-10-CM | POA: Diagnosis not present

## 2017-10-05 DIAGNOSIS — R1011 Right upper quadrant pain: Secondary | ICD-10-CM | POA: Insufficient documentation

## 2017-10-05 DIAGNOSIS — R1013 Epigastric pain: Secondary | ICD-10-CM | POA: Diagnosis not present

## 2017-10-05 DIAGNOSIS — Z87891 Personal history of nicotine dependence: Secondary | ICD-10-CM | POA: Insufficient documentation

## 2017-10-05 DIAGNOSIS — R1010 Upper abdominal pain, unspecified: Secondary | ICD-10-CM

## 2017-10-05 DIAGNOSIS — Z79899 Other long term (current) drug therapy: Secondary | ICD-10-CM | POA: Insufficient documentation

## 2017-10-05 LAB — URINALYSIS, ROUTINE W REFLEX MICROSCOPIC
BILIRUBIN URINE: NEGATIVE
GLUCOSE, UA: NEGATIVE mg/dL
HGB URINE DIPSTICK: NEGATIVE
KETONES UR: NEGATIVE mg/dL
LEUKOCYTES UA: NEGATIVE
Nitrite: NEGATIVE
PH: 7 (ref 5.0–8.0)
PROTEIN: NEGATIVE mg/dL
Specific Gravity, Urine: 1.012 (ref 1.005–1.030)

## 2017-10-05 LAB — COMPREHENSIVE METABOLIC PANEL
ALBUMIN: 4.2 g/dL (ref 3.5–5.0)
ALT: 21 U/L (ref 14–54)
AST: 17 U/L (ref 15–41)
Alkaline Phosphatase: 119 U/L (ref 38–126)
Anion gap: 7 (ref 5–15)
BUN: 13 mg/dL (ref 6–20)
CHLORIDE: 105 mmol/L (ref 101–111)
CO2: 24 mmol/L (ref 22–32)
CREATININE: 0.91 mg/dL (ref 0.44–1.00)
Calcium: 9 mg/dL (ref 8.9–10.3)
GFR calc Af Amer: >60 mL/min (ref >60)
GLUCOSE: 99 mg/dL (ref 65–99)
Potassium: 3.7 mmol/L (ref 3.5–5.1)
Sodium: 136 mmol/L (ref 135–145)
Total Bilirubin: 0.3 mg/dL (ref 0.3–1.2)
Total Protein: 7.1 g/dL (ref 6.5–8.1)

## 2017-10-05 LAB — CBC
HCT: 39.8 % (ref 36.0–46.0)
Hemoglobin: 13.9 g/dL (ref 12.0–15.0)
MCH: 30.8 pg (ref 26.0–34.0)
MCHC: 34.9 g/dL (ref 30.0–36.0)
MCV: 88.2 fL (ref 78.0–100.0)
PLATELETS: 308 10*3/uL (ref 150–400)
RBC: 4.51 MIL/uL (ref 3.87–5.11)
RDW: 12.5 % (ref 11.5–15.5)
WBC: 11 10*3/uL — AB (ref 4.0–10.5)

## 2017-10-05 LAB — LIPASE, BLOOD: LIPASE: 34 U/L (ref 11–51)

## 2017-10-05 LAB — I-STAT BETA HCG BLOOD, ED (MC, WL, AP ONLY): I-stat hCG, quantitative: <5 m[IU]/mL (ref <5)

## 2017-10-05 MED ORDER — MORPHINE SULFATE (PF) 4 MG/ML IV SOLN
4.0000 mg | Freq: Once | INTRAVENOUS | Status: AC
  Administered 2017-10-05: 4 mg via INTRAVENOUS
  Filled 2017-10-05: qty 1

## 2017-10-05 MED ORDER — ONDANSETRON HCL 4 MG/2ML IJ SOLN
4.0000 mg | Freq: Once | INTRAMUSCULAR | Status: AC
  Administered 2017-10-05: 4 mg via INTRAVENOUS
  Filled 2017-10-05: qty 2

## 2017-10-05 MED ORDER — SODIUM CHLORIDE 0.9 % IV BOLUS (SEPSIS)
1000.0000 mL | Freq: Once | INTRAVENOUS | Status: AC
  Administered 2017-10-05: 1000 mL via INTRAVENOUS

## 2017-10-05 NOTE — ED Provider Notes (Signed)
TIME SEEN: 11:30 PM  CHIEF COMPLAINT: Upper abdominal pain  HPI: Patient is a 26 year old female with history of obesity who presents to the emergency sharp abdominal pain for the past 3 days.  Has had associated nausea.  No fevers, chills, vomiting, diarrhea, bloody stools or melena.  Patient has a Nexplanon in place so she is not sure of her last menstrual period.  She denies previous history of abdominal surgeries.  States she was put on medication by her primary care physician for acid reflux and has taken 2 doses without any relief.  States that she had normal blood work drawn at her primary care doctor's office.  Pain worse with palpation, movement, deep inspiration.  ROS: See HPI Constitutional: no fever  Eyes: no drainage  ENT: no runny nose   Cardiovascular:  no chest pain  Resp: no SOB  GI: no vomiting GU: no dysuria Integumentary: no rash  Allergy: no hives  Musculoskeletal: no leg swelling  Neurological: no slurred speech ROS otherwise negative  PAST MEDICAL HISTORY/PAST SURGICAL HISTORY:  Past Medical History:  Diagnosis Date  . Obesity   . Speech impediment     MEDICATIONS:  Prior to Admission medications   Medication Sig Start Date End Date Taking? Authorizing Provider  baclofen (LIORESAL) 10 MG tablet Take 10 mg 2 (two) times daily as needed by mouth. 08/21/17  Yes [provider]  dexmethylphenidate (FOCALIN XR) 20 MG 24 hr capsule Take 20 mg daily by mouth. 08/29/17  Yes [provider]  etonogestrel (NEXPLANON) 68 MG IMPL implant 1 each once by Subdermal route.   Yes [provider]  ibuprofen (ADVIL,MOTRIN) 200 MG tablet Take 400 mg by mouth every 6 (six) hours as needed for pain.   Yes [provider]  traMADol (ULTRAM) 50 MG tablet Take 50 mg every 4 (four) hours as needed by mouth for pain. 09/07/17  Yes [provider]  zonisamide (ZONEGRAN) 25 MG capsule Take 100 mg at bedtime by mouth. 09/18/17  Yes [provider]  nortriptyline (PAMELOR) 10 MG capsule TAKE 3 CAPSULES (30 MG TOTAL) BY MOUTH AT BEDTIME. Patient not taking: Reported on 10/05/2017 01/31/16   Nilda RiggsMartin, Nancy Carolyn, NP  ondansetron [facility](ZOFRAN ODT) 4 MG disintegrating tablet 4mg  ODT q4 hours prn nausea/vomit Patient not taking: Reported on 10/05/2017 07/20/16   Felicie MornSmith, David, NP    ALLERGIES:  No Known Allergies  SOCIAL HISTORY:  Social History   Tobacco Use  . Smoking status: Former Smoker    Types: Cigarettes    Start date: 08/08/2011  . Smokeless tobacco: Never Used  Substance Use Topics  . Alcohol use: Yes    Alcohol/week: 0.0 oz    Comment: rarely    FAMILY HISTORY: Family History  Problem Relation Age of Onset  . Cancer Mother        Tonsil cancer  . Diabetes Father        type 2  . Migraines Father     EXAM: BP 120/80   Pulse 95   Temp 99 F (37.2 C) (Oral)   Resp 18   SpO2 100%  CONSTITUTIONAL: Alert and oriented and responds appropriately to questions. Well-appearing; well-nourished HEAD: Normocephalic EYES: Conjunctivae clear, pupils appear equal, EOMI ENT: normal nose; moist mucous membranes NECK: Supple, no meningismus, no nuchal rigidity, no LAD  CARD: RRR; S1 and S2 appreciated; no murmurs, no clicks, no rubs, no gallops RESP: Normal chest excursion without splinting or tachypnea; breath sounds clear and equal bilaterally;  no wheezes, no rhonchi, no rales, no hypoxia or respiratory distress, speaking full sentences ABD/GI: Normal bowel sounds; non-distended; soft, tender in the epigastric region or right upper quadrant with a positive Murphy sign, no rebound, no guarding, no peritoneal signs, no hepatosplenomegaly BACK:  The back appears normal and is non-tender to palpation, there is no CVA tenderness EXT: Normal ROM in all joints; non-tender to palpation; no edema; normal capillary refill; no cyanosis, no calf tenderness or swelling    SKIN: Normal color for age and race; warm; no rash NEURO:  Moves all extremities equally PSYCH: The patient's mood and manner are appropriate. Grooming and personal hygiene are appropriate.  MEDICAL DECISION MAKING: Patient here with right upper quadrant pain and epigastric pain.  Differential diagnosis includes gastritis, cholelithiasis, cholecystitis, pancreatitis, gastric ulcer.  Doubt perforation.  Doubt appendicitis, diverticulitis, colitis or bowel obstruction.  Labs here are unremarkable.  Urine shows no sign of infection.  LFTs, lipase normal.  She is not pregnant.  Will obtain right upper quadrant ultrasound.  Will treat symptoms with morphine, Zofran, IV fluids.  ED PROGRESS: Patient's right upper quadrant ultrasound is negative.  She does report some pleuritic pain.  We will add on a d-dimer and chest x-ray.  Will give GI cocktail and Protonix.   Patient's d-dimer is positive.  Her chest x-ray shows right middle lobe atelectasis with no other acute abnormality.  Will obtain a CTA of the chest as well as the abdomen and pelvis.   Patient CT scan of her chest and abdomen are unremarkable.  Nothing to explain her pleuritic pain.  Pancreas, liver, gallbladder, appendix appear normal.  No PE or pneumonia.  Could be gastritis, GERD.  No sign of perforated ulcer.  Have recommended GI follow-up.  Will discharge with Carafate and Zofran.  Patient is already on a PPI.  She has a PCP for follow-up.  Recommended diet changes.  Discussed return precautions.    At this time, I do not feel there is any life-threatening condition present. I have reviewed and discussed all results (EKG, imaging, lab, urine as appropriate) and exam findings with patient/family. I have reviewed nursing notes and appropriate previous records.  I feel the patient is safe to be discharged home without further emergent workup and can continue workup as an outpatient as needed. Discussed usual and customary return precautions. Patient/family verbalize understanding and are comfortable  with this plan.  Outpatient follow-up has been provided if needed. All questions have been answered.     Ansen Sayegh, Layla MawKristen N, DO 10/06/17 [phone]

## 2017-10-05 NOTE — ED Triage Notes (Signed)
Pt presents to ED for assessment of RUQ abdominal pain x 3 days, seen by PCP.  States pain has not gotten worse, but haas not gotten better.  C/o nausea, denies vomiting and diarrhea.  LMP - pt on injection.

## 2017-10-06 ENCOUNTER — Emergency Department (HOSPITAL_COMMUNITY): Payer: 59

## 2017-10-06 LAB — D-DIMER, QUANTITATIVE: D-Dimer, Quant: 0.73 ug/mL-FEU — ABNORMAL HIGH (ref 0.00–0.50)

## 2017-10-06 MED ORDER — ONDANSETRON 4 MG PO TBDP: 4.0000 mg | ORAL_TABLET | Freq: Three times a day (TID) | ORAL | 0 refills | Status: DC | PRN

## 2017-10-06 MED ORDER — GI COCKTAIL ~~LOC~~
30.0000 mL | Freq: Once | ORAL | Status: AC
  Administered 2017-10-06: 30 mL via ORAL
  Filled 2017-10-06: qty 30

## 2017-10-06 MED ORDER — SUCRALFATE 1 GM/10ML PO SUSP: 1.0000 g | Freq: Three times a day (TID) | ORAL | 0 refills | Status: DC

## 2017-10-06 MED ORDER — PANTOPRAZOLE SODIUM 40 MG IV SOLR
40.0000 mg | Freq: Once | INTRAVENOUS | Status: AC
  Administered 2017-10-06: 40 mg via INTRAVENOUS
  Filled 2017-10-06: qty 40

## 2017-10-06 MED ORDER — HYDROMORPHONE HCL 1 MG/ML IJ SOLN
1.0000 mg | Freq: Once | INTRAMUSCULAR | Status: AC
  Administered 2017-10-06: 1 mg via INTRAVENOUS
  Filled 2017-10-06: qty 1

## 2017-10-06 MED ORDER — IOPAMIDOL (ISOVUE-370) INJECTION 76%
INTRAVENOUS | Status: AC
  Administered 2017-10-06: 100 mL
  Filled 2017-10-06: qty 100

## 2017-10-06 NOTE — ED Notes (Signed)
Patient transported to CT 

## 2017-10-06 NOTE — ED Notes (Signed)
Pt departed in NAD, refused use of wheelchair.  

## 2017-10-06 NOTE — ED Notes (Signed)
Patient transported to Ultrasound 

## 2017-10-06 NOTE — Discharge Instructions (Signed)
Please avoid NSAIDs such as aspirin (Goody powders), ibuprofen (Motrin, Advil), naproxen (Aleve) as these may worsen your symptoms.  Tylenol 1000 mg every 6 hours is safe to take as long as you have no history of liver problems (heavy alcohol use, cirrhosis, hepatitis).  Please avoid spicy, acidic (citrus fruits, tomato based sauces, salsa), greasy, fatty foods.  Please avoid caffeine and alcohol.   ° °

## 2017-10-06 NOTE — ED Notes (Signed)
Patient transported to X-ray 

## 2017-11-29 ENCOUNTER — Encounter: Payer: Self-pay | Admitting: Women's Health

## 2017-11-29 ENCOUNTER — Ambulatory Visit: Payer: BLUE CROSS/BLUE SHIELD | Admitting: Women's Health

## 2017-11-29 VITALS — BP 118/80 | Ht 64.0 in | Wt 186.0 lb

## 2017-11-29 DIAGNOSIS — B9689 Other specified bacterial agents as the cause of diseases classified elsewhere: Secondary | ICD-10-CM

## 2017-11-29 DIAGNOSIS — N760 Acute vaginitis: Secondary | ICD-10-CM | POA: Diagnosis not present

## 2017-11-29 DIAGNOSIS — Z833 Family history of diabetes mellitus: Secondary | ICD-10-CM | POA: Diagnosis not present

## 2017-11-29 DIAGNOSIS — N898 Other specified noninflammatory disorders of vagina: Secondary | ICD-10-CM

## 2017-11-29 DIAGNOSIS — Z01419 Encounter for gynecological examination (general) (routine) without abnormal findings: Secondary | ICD-10-CM | POA: Diagnosis not present

## 2017-11-29 LAB — WET PREP FOR TRICH, YEAST, CLUE

## 2017-11-29 LAB — CBC WITH DIFFERENTIAL/PLATELET
BASOS ABS: 43 {cells}/uL (ref 0–200)
Basophils Relative: 0.5 %
EOS ABS: 215 {cells}/uL (ref 15–500)
Eosinophils Relative: 2.5 %
HCT: 38.9 % (ref 35.0–45.0)
HEMOGLOBIN: 13.7 g/dL (ref 11.7–15.5)
Lymphs Abs: 2417 cells/uL (ref 850–3900)
MCH: 30.4 pg (ref 27.0–33.0)
MCHC: 35.2 g/dL (ref 32.0–36.0)
MCV: 86.3 fL (ref 80.0–100.0)
MONOS PCT: 8.7 %
MPV: 10.2 fL (ref 7.5–12.5)
NEUTROS ABS: 5177 {cells}/uL (ref 1500–7800)
Neutrophils Relative %: 60.2 %
Platelets: 312 10*3/uL (ref 140–400)
RBC: 4.51 10*6/uL (ref 3.80–5.10)
RDW: 11.9 % (ref 11.0–15.0)
Total Lymphocyte: 28.1 %
WBC: 8.6 10*3/uL (ref 3.8–10.8)
WBCMIX: 748 {cells}/uL (ref 200–950)

## 2017-11-29 LAB — GLUCOSE, RANDOM: Glucose, Bld: 91 mg/dL (ref 65–99)

## 2017-11-29 MED ORDER — METRONIDAZOLE 500 MG PO TABS: 500.0000 mg | ORAL_TABLET | Freq: Two times a day (BID) | ORAL | 0 refills | Status: DC

## 2017-11-29 NOTE — Patient Instructions (Addendum)
Etonogestrel implant What is this medicine? ETONOGESTREL (et oh noe JES trel) is a contraceptive (birth control) device. It is used to prevent pregnancy. It can be used for up to 3 years. This medicine may be used for other purposes; ask your health  Health Maintenance, Female Adopting a healthy lifestyle and getting preventive care can go a long way to promote health and wellness. Talk with your health care provider about what schedule of regular examinations is right for you. This is a good chance for you to check in with your provider about disease prevention and staying healthy. In between checkups, there are plenty of things you can do on your own. Experts have done a lot of research about which lifestyle changes and preventive measures are most likely to keep you healthy. Ask your health care provider for more information. Weight and diet Eat a healthy diet  Be sure to include plenty of vegetables, fruits, low-fat dairy products, and lean protein.  Do not eat a lot of foods high in solid fats, added sugars, or salt.  Get regular exercise. This is one of the most important things you can do for your health. ? Most adults should exercise for at least 150 minutes each week. The exercise should increase your heart rate and make you sweat (moderate-intensity exercise). ? Most adults should also do strengthening exercises at least twice a week. This is in addition to the moderate-intensity exercise.  Maintain a healthy weight  Body mass index (BMI) is a measurement that can be used to identify possible weight problems. It estimates body fat based on height and weight. Your health care provider can help determine your BMI and help you achieve or maintain a healthy weight.  For females 6 years of age and older: ? A BMI below 18.5 is considered underweight. ? A BMI of 18.5 to 24.9 is normal. ? A BMI of 25 to 29.9 is considered overweight. ? A BMI of 30 and above is considered obese.  Watch  levels of cholesterol and blood lipids  You should start having your blood tested for lipids and cholesterol at 27 years of age, then have this test every 5 years.  You may need to have your cholesterol levels checked more often if: ? Your lipid or cholesterol levels are high. ? You are older than 27 years of age. ? You are at high risk for heart disease.  Cancer screening Lung Cancer  Lung cancer screening is recommended for adults 16-73 years old who are at high risk for lung cancer because of a history of smoking.  A yearly low-dose CT scan of the lungs is recommended for people who: ? Currently smoke. ? Have quit within the past 15 years. ? Have at least a 30-pack-year history of smoking. A pack year is smoking an average of one pack of cigarettes a day for 1 year.  Yearly screening should continue until it has been 15 years since you quit.  Yearly screening should stop if you develop a health problem that would prevent you from having lung cancer treatment.  Breast Cancer  Practice breast self-awareness. This means understanding how your breasts normally appear and feel.  It also means doing regular breast self-exams. Let your health care provider know about any changes, no matter how small.  If you are in your 20s or 30s, you should have a clinical breast exam (CBE) by a health care provider every 1-3 years as part of a regular health exam.  If  you are 40 or older, have a CBE every year. Also consider having a breast X-ray (mammogram) every year.  If you have a family history of breast cancer, talk to your health care provider about genetic screening.  If you are at high risk for breast cancer, talk to your health care provider about having an MRI and a mammogram every year.  Breast cancer gene (BRCA) assessment is recommended for women who have family members with BRCA-related cancers. BRCA-related cancers include: ? Breast. ? Ovarian. ? Tubal. ? Peritoneal  cancers.  Results of the assessment will determine the need for genetic counseling and BRCA1 and BRCA2 testing.  Cervical Cancer Your health care provider may recommend that you be screened regularly for cancer of the pelvic organs (ovaries, uterus, and vagina). This screening involves a pelvic examination, including checking for microscopic changes to the surface of your cervix (Pap test). You may be encouraged to have this screening done every 3 years, beginning at age 70.  For women ages 36-65, health care providers may recommend pelvic exams and Pap testing every 3 years, or they may recommend the Pap and pelvic exam, combined with testing for human papilloma virus (HPV), every 5 years. Some types of HPV increase your risk of cervical cancer. Testing for HPV may also be done on women of any age with unclear Pap test results.  Other health care providers may not recommend any screening for nonpregnant women who are considered low risk for pelvic cancer and who do not have symptoms. Ask your health care provider if a screening pelvic exam is right for you.  If you have had past treatment for cervical cancer or a condition that could lead to cancer, you need Pap tests and screening for cancer for at least 20 years after your treatment. If Pap tests have been discontinued, your risk factors (such as having a new sexual partner) need to be reassessed to determine if screening should resume. Some women have medical problems that increase the chance of getting cervical cancer. In these cases, your health care provider may recommend more frequent screening and Pap tests.  Colorectal Cancer  This type of cancer can be detected and often prevented.  Routine colorectal cancer screening usually begins at 27 years of age and continues through 27 years of age.  Your health care provider may recommend screening at an earlier age if you have risk factors for colon cancer.  Your health care provider may also  recommend using home test kits to check for hidden blood in the stool.  A small camera at the end of a tube can be used to examine your colon directly (sigmoidoscopy or colonoscopy). This is done to check for the earliest forms of colorectal cancer.  Routine screening usually begins at age 44.  Direct examination of the colon should be repeated every 5-10 years through 27 years of age. However, you may need to be screened more often if early forms of precancerous polyps or small growths are found.  Skin Cancer  Check your skin from head to toe regularly.  Tell your health care provider about any new moles or changes in moles, especially if there is a change in a mole's shape or color.  Also tell your health care provider if you have a mole that is larger than the size of a pencil eraser.  Always use sunscreen. Apply sunscreen liberally and repeatedly throughout the day.  Protect yourself by wearing long sleeves, pants, a wide-brimmed hat, and sunglasses  whenever you are outside.  Heart disease, diabetes, and high blood pressure  High blood pressure causes heart disease and increases the risk of stroke. High blood pressure is more likely to develop in: ? People who have blood pressure in the high end of the normal range (130-139/85-89 mm Hg). ? People who are overweight or obese. ? People who are African American.  If you are 68-55 years of age, have your blood pressure checked every 3-5 years. If you are 73 years of age or older, have your blood pressure checked every year. You should have your blood pressure measured twice-once when you are at a hospital or clinic, and once when you are not at a hospital or clinic. Record the average of the two measurements. To check your blood pressure when you are not at a hospital or clinic, you can use: ? An automated blood pressure machine at a pharmacy. ? A home blood pressure monitor.  If you are between 65 years and 31 years old, ask your  health care provider if you should take aspirin to prevent strokes.  Have regular diabetes screenings. This involves taking a blood sample to check your fasting blood sugar level. ? If you are at a normal weight and have a low risk for diabetes, have this test once every three years after 27 years of age. ? If you are overweight and have a high risk for diabetes, consider being tested at a younger age or more often. Preventing infection Hepatitis B  If you have a higher risk for hepatitis B, you should be screened for this virus. You are considered at high risk for hepatitis B if: ? You were born in a country where hepatitis B is common. Ask your health care provider which countries are considered high risk. ? Your parents were born in a high-risk country, and you have not been immunized against hepatitis B (hepatitis B vaccine). ? You have HIV or AIDS. ? You use needles to inject street drugs. ? You live with someone who has hepatitis B. ? You have had sex with someone who has hepatitis B. ? You get hemodialysis treatment. ? You take certain medicines for conditions, including cancer, organ transplantation, and autoimmune conditions.  Hepatitis C  Blood testing is recommended for: ? Everyone born from [birth year] through 1965. ? Anyone with known risk factors for hepatitis C.  Sexually transmitted infections (STIs)  You should be screened for sexually transmitted infections (STIs) including gonorrhea and chlamydia if: ? You are sexually active and are younger than 27 years of age. ? You are older than 27 years of age and your health care provider tells you that you are at risk for this type of infection. ? Your sexual activity has changed since you were last screened and you are at an increased risk for chlamydia or gonorrhea. Ask your health care provider if you are at risk.  If you do not have HIV, but are at risk, it may be recommended that you take a prescription medicine daily to  prevent HIV infection. This is called pre-exposure prophylaxis (PrEP). You are considered at risk if: ? You are sexually active and do not regularly use condoms or know the HIV status of your partner(s). ? You take drugs by injection. ? You are sexually active with a partner who has HIV.  Talk with your health care provider about whether you are at high risk of being infected with HIV. If you choose to begin PrEP, you should  first be tested for HIV. You should then be tested every 3 months for as long as you are taking PrEP. Pregnancy  If you are premenopausal and you may become pregnant, ask your health care provider about preconception counseling.  If you may become pregnant, take 400 to 800 micrograms (mcg) of folic acid every day.  If you want to prevent pregnancy, talk to your health care provider about birth control (contraception). Osteoporosis and menopause  Osteoporosis is a disease in which the bones lose minerals and strength with aging. This can result in serious bone fractures. Your risk for osteoporosis can be identified using a bone density scan.  If you are 46 years of age or older, or if you are at risk for osteoporosis and fractures, ask your health care provider if you should be screened.  Ask your health care provider whether you should take a calcium or vitamin D supplement to lower your risk for osteoporosis.  Menopause may have certain physical symptoms and risks.  Hormone replacement therapy may reduce some of these symptoms and risks. Talk to your health care provider about whether hormone replacement therapy is right for you. Follow these instructions at home:  Schedule regular health, dental, and eye exams.  Stay current with your immunizations.  Do not use any tobacco products including cigarettes, chewing tobacco, or electronic cigarettes.  If you are pregnant, do not drink alcohol.  If you are breastfeeding, limit how much and how often you drink  alcohol.  Limit alcohol intake to no more than 1 drink per day for nonpregnant women. One drink equals 12 ounces of beer, 5 ounces of wine, or 1 ounces of hard liquor.  Do not use street drugs.  Do not share needles.  Ask your health care provider for help if you need support or information about quitting drugs.  Tell your health care provider if you often feel depressed.  Tell your health care provider if you have ever been abused or do not feel safe at home. This information is not intended to replace advice given to you by your health care provider. Make sure you discuss any questions you have with your health care provider. Document Released: 05/29/2011 Document Revised: 04/20/2016 Document Reviewed: 08/17/2015 Elsevier Interactive Patient Education  2018 [employer]. care provider or pharmacist if you have questions. COMMON BRAND NAME(S): Implanon, Nexplanon What should I tell my health care provider before I take this medicine? They need to know if you have any of these conditions: -abnormal vaginal bleeding -blood vessel disease or blood clots -cancer of the breast, cervix, or liver -depression -diabetes -gallbladder disease -headaches -heart disease or recent heart attack -high blood pressure -high cholesterol -kidney disease -liver disease -renal disease -seizures -tobacco smoker -an unusual or allergic reaction to etonogestrel, other hormones, anesthetics or antiseptics, medicines, foods, dyes, or preservatives -pregnant or trying to get pregnant -breast-feeding How should I use this medicine? This device is inserted just under the skin on the inner side of your upper arm by a health care professional. Talk to your pediatrician regarding the use of this medicine in children. Special care may be needed. Overdosage: If you think you have taken too much of this medicine contact a poison control center or emergency room at once. NOTE: This medicine is only for you.  Do not share this medicine with others. What if I miss a dose? This does not apply. What may interact with this medicine? Do not take this medicine with any of the  following medications: -amprenavir -bosentan -fosamprenavir This medicine may also interact with the following medications: -barbiturate medicines for inducing sleep or treating seizures -certain medicines for fungal infections like ketoconazole and itraconazole -grapefruit juice -griseofulvin -medicines to treat seizures like carbamazepine, felbamate, oxcarbazepine, phenytoin, topiramate -modafinil -phenylbutazone -rifampin -rufinamide -some medicines to treat HIV infection like atazanavir, indinavir, lopinavir, nelfinavir, tipranavir, ritonavir -St. John's wort This list may not describe all possible interactions. Give your health care provider a list of all the medicines, herbs, non-prescription drugs, or dietary supplements you use. Also tell them if you smoke, drink alcohol, or use illegal drugs. Some items may interact with your medicine. What should I watch for while using this medicine? This product does not protect you against HIV infection (AIDS) or other sexually transmitted diseases. You should be able to feel the implant by pressing your fingertips over the skin where it was inserted. Contact your doctor if you cannot feel the implant, and use a non-hormonal birth control method (such as condoms) until your doctor confirms that the implant is in place. If you feel that the implant may have broken or become bent while in your arm, contact your healthcare provider. What side effects may I notice from receiving this medicine? Side effects that you should report to your doctor or health care professional as soon as possible: -allergic reactions like skin rash, itching or hives, swelling of the face, lips, or tongue -breast lumps -changes in emotions or moods -depressed mood -heavy or prolonged menstrual  bleeding -pain, irritation, swelling, or bruising at the insertion site -scar at site of insertion -signs of infection at the insertion site such as fever, and skin redness, pain or discharge -signs of pregnancy -signs and symptoms of a blood clot such as breathing problems; changes in vision; chest pain; severe, sudden headache; pain, swelling, warmth in the leg; trouble speaking; sudden numbness or weakness of the face, arm or leg -signs and symptoms of liver injury like dark yellow or brown urine; general ill feeling or flu-like symptoms; light-colored stools; loss of appetite; nausea; right upper belly pain; unusually weak or tired; yellowing of the eyes or skin -unusual vaginal bleeding, discharge -signs and symptoms of a stroke like changes in vision; confusion; trouble speaking or understanding; severe headaches; sudden numbness or weakness of the face, arm or leg; trouble walking; dizziness; loss of balance or coordination Side effects that usually do not require medical attention (report to your doctor or health care professional if they continue or are bothersome): -acne -back pain -breast pain -changes in weight -dizziness -general ill feeling or flu-like symptoms -headache -irregular menstrual bleeding -nausea -sore throat -vaginal irritation or inflammation This list may not describe all possible side effects. Call your doctor for medical advice about side effects. You may report side effects to FDA at 1-800-FDA-1088. Where should I keep my medicine? This drug is given in a hospital or clinic and will not be stored at home. NOTE: This sheet is a summary. It may not cover all possible information. If you have questions about this medicine, talk to your doctor, pharmacist, or health care provider.  2018 Elsevier/Gold Standard (2016-06-01 11:19:22)  Bacterial Vaginosis Bacterial vaginosis is an infection of the vagina. It happens when too many germs (bacteria) grow in the  vagina. This infection puts you at risk for infections from sex (STIs). Treating this infection can lower your risk for some STIs. You should also treat this if you are pregnant. It can cause your baby to be born  early. Follow these instructions at home: Medicines  Take over-the-counter and prescription medicines only as told by your doctor.  Take or use your antibiotic medicine as told by your doctor. Do not stop taking or using it even if you start to feel better. General instructions  If you your sexual partner is a woman, tell her that you have this infection. She needs to get treatment if she has symptoms. If you have a female partner, he does not need to be treated.  During treatment: ? Avoid sex. ? Do not douche. ? Avoid alcohol as told. ? Avoid breastfeeding as told.  Drink enough fluid to keep your pee (urine) clear or pale yellow.  Keep your vagina and butt (rectum) clean. ? Wash the area with warm water every day. ? Wipe from front to back after you use the toilet.  Keep all follow-up visits as told by your doctor. This is important. Preventing this condition  Do not douche.  Use only warm water to wash around your vagina.  Use protection when you have sex. This includes: ? Latex condoms. ? Dental dams.  Limit how many people you have sex with. It is best to only have sex with the same person (be monogamous).  Get tested for STIs. Have your partner get tested.  Wear underwear that is cotton or lined with cotton.  Avoid tight pants and pantyhose. This is most important in summer.  Do not use any products that have nicotine or tobacco in them. These include cigarettes and e-cigarettes. If you need help quitting, ask your doctor.  Do not use illegal drugs.  Limit how much alcohol you drink. Contact a doctor if:  Your symptoms do not get better, even after you are treated.  You have more discharge or pain when you pee (urinate).  You have a fever.  You have  pain in your belly (abdomen).  You have pain with sex.  Your bleed from your vagina between periods. Summary  This infection happens when too many germs (bacteria) grow in the vagina.  Treating this condition can lower your risk for some infections from sex (STIs).  You should also treat this if you are pregnant. It can cause early (premature) birth.  Do not stop taking or using your antibiotic medicine even if you start to feel better. This information is not intended to replace advice given to you by your health care provider. Make sure you discuss any questions you have with your health care provider. Document Released: 08/22/2008 Document Revised: 07/29/2016 Document Reviewed: 07/29/2016 Elsevier Interactive Patient Education  2017 [employer].

## 2017-11-29 NOTE — Progress Notes (Signed)
Freeman CaldronOlivia Mcever [birth year] [id number]    History:    Presents for annual exam. 02/2015 Nexplanon had rare bleeding, has recently started having some irregular spotting. Same partner with negative STD screen,  denies need for screening. Normal Pap history. Gardasil series completed. Last annual exam 12/2014. Migraines-neurologist managing.  Past medical history, past surgical history, family history and social history were all reviewed and documented in the EPIC chart. Attending [employer]. Father diabetes.  ROS:  A ROS was performed and pertinent positives and negatives are included.  Exam:  Vitals:   11/29/17 1501  BP: 118/80  Weight: 186 lb (84.4 kg)  Height: 5\' 4"  (1.626 m)   Body mass index is 31.93 kg/m.   General appearance:  Normal Thyroid:  Symmetrical, normal in size, without palpable masses or nodularity. Respiratory  Auscultation:  Clear without wheezing or rhonchi Cardiovascular  Auscultation:  Regular rate, without rubs, murmurs or gallops  Edema/varicosities:  Not grossly evident Abdominal  Soft,nontender, without masses, guarding or rebound.  Liver/spleen:  No organomegaly noted  Hernia:  None appreciated  Skin  Inspection:  Grossly normal   Breasts: Examined lying and sitting.     Right: Without masses, retractions, discharge or axillary adenopathy.     Left: Without masses, retractions, discharge or axillary adenopathy. Gentitourinary   Inguinal/mons:  Normal without inguinal adenopathy  External genitalia:  Normal  BUS/Urethra/Skene's glands:  Normal  Vagina:  Normal scant menses type blood with odor  Cervix:  Normal  Uterus:  normal in size, shape and contour.  Midline and mobile  Adnexa/parametria:     Rt: Without masses or tenderness.   Lt: Without masses or tenderness.  Anus and perineum: Normal  Digital rectal exam: Normal sphincter tone without palpated masses or tenderness  Assessment/Plan:  27 y.o. SBF  for annual exam with no complaints.  02/2015  Nexplanon rare bleeding Obesity Bacteria vaginosis  Plan: Contraception options reviewed would like to continue with Nexplanon will check coverage, return to office in March for April for Dr. Audie BoxFontaine to remove and replace Nexplanon. Flagyl 500 twice daily for 7 days, prescription, proper use given and reviewed. SBE's, decrease calories/carbs, increase exercise, calcium rich diet, MVI daily encouraged. Campus safety reviewed. CBC,  glucose, Pap.    Harrington Challengerancy J Lasheika Ortloff [facility] LLCWHNP, 5:39 PM 11/29/2017

## 2017-11-30 LAB — PAP IG W/ RFLX HPV ASCU

## 2017-12-19 ENCOUNTER — Encounter: Payer: Self-pay | Admitting: Women's Health

## 2017-12-19 ENCOUNTER — Other Ambulatory Visit: Payer: Self-pay | Admitting: Women's Health

## 2017-12-19 MED ORDER — FLUCONAZOLE 150 MG PO TABS
150.0000 mg | ORAL_TABLET | Freq: Once | ORAL | 1 refills | Status: AC
Start: 2017-12-19 — End: 2017-12-19

## 2018-01-16 ENCOUNTER — Ambulatory Visit: Payer: BLUE CROSS/BLUE SHIELD | Admitting: Gynecology

## 2018-01-16 ENCOUNTER — Encounter: Payer: Self-pay | Admitting: Gynecology

## 2018-01-16 VITALS — BP 122/78

## 2018-01-16 DIAGNOSIS — B373 Candidiasis of vulva and vagina: Secondary | ICD-10-CM | POA: Diagnosis not present

## 2018-01-16 DIAGNOSIS — N760 Acute vaginitis: Secondary | ICD-10-CM | POA: Diagnosis not present

## 2018-01-16 DIAGNOSIS — Z3046 Encounter for surveillance of implantable subdermal contraceptive: Secondary | ICD-10-CM | POA: Diagnosis not present

## 2018-01-16 DIAGNOSIS — Z30017 Encounter for initial prescription of implantable subdermal contraceptive: Secondary | ICD-10-CM | POA: Diagnosis not present

## 2018-01-16 DIAGNOSIS — B9689 Other specified bacterial agents as the cause of diseases classified elsewhere: Secondary | ICD-10-CM | POA: Diagnosis not present

## 2018-01-16 DIAGNOSIS — B3731 Acute candidiasis of vulva and vagina: Secondary | ICD-10-CM

## 2018-01-16 HISTORY — PX: OTHER SURGICAL HISTORY: SHX169

## 2018-01-16 LAB — WET PREP FOR TRICH, YEAST, CLUE

## 2018-01-16 MED ORDER — FLUCONAZOLE 150 MG PO TABS: 150.0000 mg | ORAL_TABLET | Freq: Every day | ORAL | 0 refills | Status: DC

## 2018-01-16 MED ORDER — CLINDAMYCIN PHOSPHATE 2 % VA CREA: 1.0000 | TOPICAL_CREAM | Freq: Every day | VAGINAL | 0 refills | Status: DC

## 2018-01-16 NOTE — Progress Notes (Signed)
    Freeman CaldronOlivia Digeronimo [birth year] [id number]        27 y.o.  G0P0 presents for:  1. Nexplanon removal and replacement.  Doing well with scant bleeding.  Due to be replaced April 2019. 2.   Persistent vaginal irritation.  Was seen by Harriett SineNancy and initially treated for bacterial vaginosis with Flagyl.  Her symptoms persisted and was given one Diflucan.  Her symptoms have persisted.  Mostly irritation externally.  Scant discharge.  No odor.  No urinary symptoms such as frequency dysuria urgency low back pain fever or chills.  Past medical history,surgical history, problem list, medications, allergies, family history and social history were all reviewed and documented in the EPIC chart.  Directed ROS with pertinent positives and negatives documented in the history of present illness/assessment and plan.  Exam: Kennon PortelaKim Gardner assistant Vitals:   01/16/18 1037  BP: 122/78   General appearance:  Normal Abdomen soft nontender without masses guarding rebound Pelvic external BUS vagina with white frothy discharge.  Generalized vulvitis noted from mons along both labia majora.  No specific lesions.  Cervix normal.  Uterus normal size midline mobile nontender.  Adnexa without masses or tenderness.  Procedure:  The removal and replacement process was reviewed with the patient and the potential risks were discussed to include bleeding, infection, damage to underlying neurovascular structures and the possibility that we may not be able to remove the rod or incomplete removal.  She is having scant bleeding now but knows that she may have irregular bleeding with the new Nexplanon.  Implant migration may also make removal difficult in the future.  Lastly the risk of failure and pregnancy was also reviewed.  The left upper, inner arm was examined with the Nexplanon rod clearly palpated. The skin overlying the distal portion of the rod was cleansed with Betadine and infiltrated with 1% lidocaine. A small skin incision was  made with a scalpel over the distal tip of the Nexplanon rod. The Nexplanon tip was subsequently delivered through the incision using blunt and sharp dissection and the tip was grasped with a forcep and the Nexplanon rod was removed intact, shown to the patient and discarded.  A new Nexplanon was then inserted according to the new manufactures recommendations using the same incision site without difficulty.  The skin incision was closed with a Steri-Strip.  The patient palpated the rod.  A pressure dressing was applied with postprocedural instructions given.   Lot number:  [id number]    Assessment/Plan:  27 y.o. G0P0 with:  1. Yeast and bacterial vaginosis on wet prep.  Will treat with Cleocin vaginal cream nightly x7 nights.  Diflucan 150 mg nightly x5 nights.  Will extend this course to allow for cutaneous treatment.  Follow-up if symptoms persist, worsen or recur. 2. Successful removal and replacement of her Nexplanon.  Patient will follow-up if she has any issues with this.  Otherwise she will follow-up routinely next year when due for annual exam.    Dara Lordsimothy P Javi Bollman MD, 10:58 AM 01/16/2018

## 2018-01-16 NOTE — Patient Instructions (Signed)
Use the vaginal cream nightly for 7 nights.  Take the Diflucan pill daily for 5 days.  Follow-up if the symptoms persist, worsen or recur.

## 2018-01-16 NOTE — Addendum Note (Signed)
Addended by: Dayna BarkerGARDNER, KIMBERLY K on: 01/16/2018 11:18 AM   Modules accepted: Orders

## 2019-02-05 ENCOUNTER — Other Ambulatory Visit: Payer: Self-pay

## 2019-02-05 ENCOUNTER — Emergency Department (HOSPITAL_COMMUNITY)
Admission: EM | Admit: 2019-02-05 | Discharge: 2019-02-06 | Disposition: A | Discharge status: Home / Self Care | Payer: BLUE CROSS/BLUE SHIELD | Attending: Emergency Medicine | Admitting: Emergency Medicine

## 2019-02-05 ENCOUNTER — Encounter (HOSPITAL_COMMUNITY): Payer: Self-pay

## 2019-02-05 DIAGNOSIS — R519 Headache, unspecified: Secondary | ICD-10-CM

## 2019-02-05 DIAGNOSIS — Z87891 Personal history of nicotine dependence: Secondary | ICD-10-CM | POA: Diagnosis not present

## 2019-02-05 DIAGNOSIS — Z79899 Other long term (current) drug therapy: Secondary | ICD-10-CM | POA: Insufficient documentation

## 2019-02-05 DIAGNOSIS — R51 Headache: Secondary | ICD-10-CM | POA: Insufficient documentation

## 2019-02-05 MED ORDER — ONDANSETRON 4 MG PO TBDP
4.0000 mg | ORAL_TABLET | Freq: Once | ORAL | Status: AC | PRN
  Administered 2019-02-05: 4 mg via ORAL
  Filled 2019-02-05: qty 1

## 2019-02-05 NOTE — ED Notes (Signed)
Pt brought back to triage room for re-eval. Has thrown up X3 in lobby.

## 2019-02-05 NOTE — ED Triage Notes (Signed)
Pt reports chronic migranes. States she has had a migrane all day, pt took home meds without relief of pain. Light sensitivity.

## 2019-02-06 MED ORDER — METOCLOPRAMIDE HCL 5 MG/ML IJ SOLN
10.0000 mg | Freq: Once | INTRAMUSCULAR | Status: AC
  Administered 2019-02-06: 10 mg via INTRAVENOUS
  Filled 2019-02-06: qty 2

## 2019-02-06 MED ORDER — SODIUM CHLORIDE 0.9 % IV BOLUS
1000.0000 mL | Freq: Once | INTRAVENOUS | Status: AC
  Administered 2019-02-06: 1000 mL via INTRAVENOUS

## 2019-02-06 MED ORDER — DIPHENHYDRAMINE HCL 50 MG/ML IJ SOLN
25.0000 mg | Freq: Once | INTRAMUSCULAR | Status: AC
  Administered 2019-02-06: 25 mg via INTRAVENOUS
  Filled 2019-02-06: qty 1

## 2019-02-06 MED ORDER — KETOROLAC TROMETHAMINE 30 MG/ML IJ SOLN
30.0000 mg | Freq: Once | INTRAMUSCULAR | Status: AC
  Administered 2019-02-06: 30 mg via INTRAVENOUS
  Filled 2019-02-06: qty 1

## 2019-02-06 NOTE — ED Provider Notes (Signed)
[facility] EMERGENCY DEPARTMENT Provider Note   CSN: [id number] Arrival date & time: 02/05/19  2015    History   Chief Complaint Chief Complaint  Patient presents with  . Migraine    HPI Sanaiya Tibbits is a 28 y.o. female.     Patient presents to the emergency department with a chief complaint of headache.  She has a history of migraines.  States that this feels similar.  Headache is progressively worsened throughout the day.  She has tried taking her home zonegran with no relief. Denies fevers, chills, or neck stiffness.  She has been seen for migraines in the past and has had relief with migraine cocktail.  The history is provided by the patient. No language interpreter was used.    Past Medical History:  Diagnosis Date  . Obesity   . Speech impediment     There are no active problems to display for this patient.   Past Surgical History:  Procedure Laterality Date  . Nexplanon  01/16/2018     OB History    Gravida  0   Para      Term      Preterm      AB      Living        SAB      TAB      Ectopic      Multiple      Live Births               Home Medications    Prior to Admission medications   Medication Sig Start Date End Date Taking? Authorizing Provider  baclofen (LIORESAL) 10 MG tablet Take 10 mg 2 (two) times daily as needed by mouth. 08/21/17   [provider]  clindamycin (CLEOCIN) 2 % vaginal cream Place 1 Applicatorful vaginally at bedtime. 01/16/18   Fontaine, Nadyne Coombes, MD  dexmethylphenidate (FOCALIN XR) 20 MG 24 hr capsule Take 20 mg daily by mouth. 08/29/17   [provider]  etonogestrel (NEXPLANON) 68 MG IMPL implant 1 each once by Subdermal route.    [provider]  fluconazole (DIFLUCAN) 150 MG tablet Take 1 tablet (150 mg total) by mouth daily. 01/16/18   Fontaine, Nadyne Coombes, MD  metroNIDAZOLE (FLAGYL) 500 MG tablet Take 1 tablet (500 mg total) by mouth 2 (two) times daily.  Patient not taking: Reported on 01/16/2018 11/29/17   Harrington Challenger, NP  zonisamide (ZONEGRAN) 25 MG capsule Take 100 mg at bedtime by mouth. 09/18/17   [provider]    Family History Family History  Problem Relation Age of Onset  . Cancer Mother        Tonsil cancer  . Diabetes Father        type 2  . Migraines Father     Social History Social History   Tobacco Use  . Smoking status: Former Smoker    Types: Cigarettes    Start date: 08/08/2011  . Smokeless tobacco: Never Used  Substance Use Topics  . Alcohol use: Yes    Alcohol/week: 0.0 standard drinks    Comment: rarely  . Drug use: No     Allergies   Patient has no known allergies.   Review of Systems Review of Systems  All other systems reviewed and are negative.    Physical Exam Updated Vital Signs BP (!) 141/91   Pulse 67   Temp 97.7 F (36.5 C) (Oral)   Resp 16   SpO2  100%   Physical Exam Vitals signs and nursing note reviewed.  Constitutional:      Appearance: She is well-developed.  HENT:     Head: Normocephalic and atraumatic.     Right Ear: External ear normal.     Left Ear: External ear normal.  Eyes:     Conjunctiva/sclera: Conjunctivae normal.     Pupils: Pupils are equal, round, and reactive to light.  Neck:     Musculoskeletal: Normal range of motion and neck supple.     Comments: No pain with neck flexion, no meningismus Cardiovascular:     Rate and Rhythm: Normal rate and regular rhythm.     Heart sounds: Normal heart sounds. No murmur. No friction rub. No gallop.   Pulmonary:     Effort: Pulmonary effort is normal. No respiratory distress.     Breath sounds: Normal breath sounds. No wheezing or rales.  Chest:     Chest wall: No tenderness.  Abdominal:     General: Bowel sounds are normal. There is no distension.     Palpations: Abdomen is soft. There is no mass.     Tenderness: There is no abdominal tenderness. There is no guarding or rebound.  Musculoskeletal:  Normal range of motion.        General: No tenderness.     Comments: Normal gait.  Skin:    General: Skin is warm and dry.  Neurological:     Mental Status: She is alert and oriented to person, place, and time.     Deep Tendon Reflexes: Reflexes are normal and symmetric.     Comments: CN 3-12 intact, normal finger to nose, no pronator drift, sensation and strength intact bilaterally.  Psychiatric:        Behavior: Behavior normal.        Thought Content: Thought content normal.        Judgment: Judgment normal.      ED Treatments / Results  Labs (all labs ordered are listed, but only abnormal results are displayed) Labs Reviewed - No data to display  EKG None  Radiology No results found.  Procedures Procedures (including critical care time)  Medications Ordered in ED Medications  ketorolac (TORADOL) 30 MG/ML injection 30 mg (has no administration in time range)  metoCLOPramide (REGLAN) injection 10 mg (has no administration in time range)  diphenhydrAMINE (BENADRYL) injection 25 mg (has no administration in time range)  sodium chloride 0.9 % bolus 1,000 mL (has no administration in time range)  ondansetron (ZOFRAN-ODT) disintegrating tablet 4 mg (4 mg Oral Given 02/05/19 2309)     Initial Impression / Assessment and Plan / ED Course  I have reviewed the triage vital signs and the nursing notes.  Pertinent labs & imaging results that were available during my care of the patient were reviewed by me and considered in my medical decision making (see chart for details).        Pt HA treated and improved while in ED.  Presentation is like pts typical HA and non concerning for [facility], [facility], Meningitis, or temporal arteritis. Pt is afebrile with no focal neuro deficits, nuchal rigidity, or change in vision. Pt is to follow up with PCP to discuss prophylactic medication. Pt verbalizes understanding and is agreeable with plan to dc.    Final Clinical Impressions(s) / ED  Diagnoses   Final diagnoses:  Nonintractable headache, unspecified chronicity pattern, unspecified headache type    ED Discharge Orders    None  Roxy Horseman, PA-C 02/06/19 0523    Ward, Layla Maw, DO 02/06/19 [phone]

## 2019-03-25 ENCOUNTER — Encounter: Payer: Self-pay | Admitting: Women's Health

## 2019-03-25 ENCOUNTER — Other Ambulatory Visit: Payer: Self-pay

## 2019-03-25 ENCOUNTER — Ambulatory Visit: Payer: BLUE CROSS/BLUE SHIELD | Admitting: Women's Health

## 2019-03-25 VITALS — BP 126/80 | Ht 64.0 in | Wt 204.0 lb

## 2019-03-25 DIAGNOSIS — Z01419 Encounter for gynecological examination (general) (routine) without abnormal findings: Secondary | ICD-10-CM | POA: Diagnosis not present

## 2019-03-25 DIAGNOSIS — N898 Other specified noninflammatory disorders of vagina: Secondary | ICD-10-CM

## 2019-03-25 DIAGNOSIS — Z975 Presence of (intrauterine) contraceptive device: Secondary | ICD-10-CM

## 2019-03-25 LAB — WET PREP FOR TRICH, YEAST, CLUE

## 2019-03-25 MED ORDER — TERCONAZOLE 0.4 % VA CREA: 1.0000 | TOPICAL_CREAM | Freq: Every day | VAGINAL | 0 refills | Status: AC

## 2019-03-25 NOTE — Patient Instructions (Signed)
Take multivit daily  Health Maintenance, Female Adopting a healthy lifestyle and getting preventive care can go a long way to promote health and wellness. Talk with your health care provider about what schedule of regular examinations is right for you. This is a good chance for you to check in with your provider about disease prevention and staying healthy. In between checkups, there are plenty of things you can do on your own. Experts have done a lot of research about which lifestyle changes and preventive measures are most likely to keep you healthy. Ask your health care provider for more information. Weight and diet Eat a healthy diet  Be sure to include plenty of vegetables, fruits, low-fat dairy products, and lean protein.  Do not eat a lot of foods high in solid fats, added sugars, or salt.  Get regular exercise. This is one of the most important things you can do for your health. ? Most adults should exercise for at least 150 minutes each week. The exercise should increase your heart rate and make you sweat (moderate-intensity exercise). ? Most adults should also do strengthening exercises at least twice a week. This is in addition to the moderate-intensity exercise. Maintain a healthy weight  Body mass index (BMI) is a measurement that can be used to identify possible weight problems. It estimates body fat based on height and weight. Your health care provider can help determine your BMI and help you achieve or maintain a healthy weight.  For females 58 years of age and older: ? A BMI below 18.5 is considered underweight. ? A BMI of 18.5 to 24.9 is normal. ? A BMI of 25 to 29.9 is considered overweight. ? A BMI of 30 and above is considered obese. Watch levels of cholesterol and blood lipids  You should start having your blood tested for lipids and cholesterol at 28 years of age, then have this test every 5 years.  You may need to have your cholesterol levels checked more often  if: ? Your lipid or cholesterol levels are high. ? You are older than 28 years of age. ? You are at high risk for heart disease. Cancer screening Lung Cancer  Lung cancer screening is recommended for adults 69-37 years old who are at high risk for lung cancer because of a history of smoking.  A yearly low-dose CT scan of the lungs is recommended for people who: ? Currently smoke. ? Have quit within the past 15 years. ? Have at least a 30-pack-year history of smoking. A pack year is smoking an average of one pack of cigarettes a day for 1 year.  Yearly screening should continue until it has been 15 years since you quit.  Yearly screening should stop if you develop a health problem that would prevent you from having lung cancer treatment. Breast Cancer  Practice breast self-awareness. This means understanding how your breasts normally appear and feel.  It also means doing regular breast self-exams. Let your health care provider know about any changes, no matter how small.  If you are in your 20s or 30s, you should have a clinical breast exam (CBE) by a health care provider every 1-3 years as part of a regular health exam.  If you are 6 or older, have a CBE every year. Also consider having a breast X-ray (mammogram) every year.  If you have a family history of breast cancer, talk to your health care provider about genetic screening.  If you are at high risk  for breast cancer, talk to your health care provider about having an MRI and a mammogram every year.  Breast cancer gene (BRCA) assessment is recommended for women who have family members with BRCA-related cancers. BRCA-related cancers include: ? Breast. ? Ovarian. ? Tubal. ? Peritoneal cancers.  Results of the assessment will determine the need for genetic counseling and BRCA1 and BRCA2 testing. Cervical Cancer Your health care provider may recommend that you be screened regularly for cancer of the pelvic organs (ovaries,  uterus, and vagina). This screening involves a pelvic examination, including checking for microscopic changes to the surface of your cervix (Pap test). You may be encouraged to have this screening done every 3 years, beginning at age 82.  For women ages 56-65, health care providers may recommend pelvic exams and Pap testing every 3 years, or they may recommend the Pap and pelvic exam, combined with testing for human papilloma virus (HPV), every 5 years. Some types of HPV increase your risk of cervical cancer. Testing for HPV may also be done on women of any age with unclear Pap test results.  Other health care providers may not recommend any screening for nonpregnant women who are considered low risk for pelvic cancer and who do not have symptoms. Ask your health care provider if a screening pelvic exam is right for you.  If you have had past treatment for cervical cancer or a condition that could lead to cancer, you need Pap tests and screening for cancer for at least 20 years after your treatment. If Pap tests have been discontinued, your risk factors (such as having a new sexual partner) need to be reassessed to determine if screening should resume. Some women have medical problems that increase the chance of getting cervical cancer. In these cases, your health care provider may recommend more frequent screening and Pap tests. Colorectal Cancer  This type of cancer can be detected and often prevented.  Routine colorectal cancer screening usually begins at 28 years of age and continues through 28 years of age.  Your health care provider may recommend screening at an earlier age if you have risk factors for colon cancer.  Your health care provider may also recommend using home test kits to check for hidden blood in the stool.  A small camera at the end of a tube can be used to examine your colon directly (sigmoidoscopy or colonoscopy). This is done to check for the earliest forms of colorectal  cancer.  Routine screening usually begins at age 79.  Direct examination of the colon should be repeated every 5-10 years through 28 years of age. However, you may need to be screened more often if early forms of precancerous polyps or small growths are found. Skin Cancer  Check your skin from head to toe regularly.  Tell your health care provider about any new moles or changes in moles, especially if there is a change in a mole's shape or color.  Also tell your health care provider if you have a mole that is larger than the size of a pencil eraser.  Always use sunscreen. Apply sunscreen liberally and repeatedly throughout the day.  Protect yourself by wearing long sleeves, pants, a wide-brimmed hat, and sunglasses whenever you are outside. Heart disease, diabetes, and high blood pressure  High blood pressure causes heart disease and increases the risk of stroke. High blood pressure is more likely to develop in: ? People who have blood pressure in the high end of the normal range (130-139/85-89  mm Hg). ? People who are overweight or obese. ? People who are African American.  If you are 18-39 years of age, have your blood pressure checked every 3-5 years. If you are 40 years of age or older, have your blood pressure checked every year. You should have your blood pressure measured twice-once when you are at a hospital or clinic, and once when you are not at a hospital or clinic. Record the average of the two measurements. To check your blood pressure when you are not at a hospital or clinic, you can use: ? An automated blood pressure machine at a pharmacy. ? A home blood pressure monitor.  If you are between 55 years and 79 years old, ask your health care provider if you should take aspirin to prevent strokes.  Have regular diabetes screenings. This involves taking a blood sample to check your fasting blood sugar level. ? If you are at a normal weight and have a low risk for diabetes,  have this test once every three years after 28 years of age. ? If you are overweight and have a high risk for diabetes, consider being tested at a younger age or more often. Preventing infection Hepatitis B  If you have a higher risk for hepatitis B, you should be screened for this virus. You are considered at high risk for hepatitis B if: ? You were born in a country where hepatitis B is common. Ask your health care provider which countries are considered high risk. ? Your parents were born in a high-risk country, and you have not been immunized against hepatitis B (hepatitis B vaccine). ? You have HIV or AIDS. ? You use needles to inject street drugs. ? You live with someone who has hepatitis B. ? You have had sex with someone who has hepatitis B. ? You get hemodialysis treatment. ? You take certain medicines for conditions, including cancer, organ transplantation, and autoimmune conditions. Hepatitis C  Blood testing is recommended for: ? Everyone born from 1945 through 1965. ? Anyone with known risk factors for hepatitis C. Sexually transmitted infections (STIs)  You should be screened for sexually transmitted infections (STIs) including gonorrhea and chlamydia if: ? You are sexually active and are younger than 28 years of age. ? You are older than 28 years of age and your health care provider tells you that you are at risk for this type of infection. ? Your sexual activity has changed since you were last screened and you are at an increased risk for chlamydia or gonorrhea. Ask your health care provider if you are at risk.  If you do not have HIV, but are at risk, it may be recommended that you take a prescription medicine daily to prevent HIV infection. This is called pre-exposure prophylaxis (PrEP). You are considered at risk if: ? You are sexually active and do not regularly use condoms or know the HIV status of your partner(s). ? You take drugs by injection. ? You are sexually  active with a partner who has HIV. Talk with your health care provider about whether you are at high risk of being infected with HIV. If you choose to begin PrEP, you should first be tested for HIV. You should then be tested every 3 months for as long as you are taking PrEP. Pregnancy  If you are premenopausal and you may become pregnant, ask your health care provider about preconception counseling.  If you may become pregnant, take 400 to 800 micrograms (mcg)   of folic acid every day.  If you want to prevent pregnancy, talk to your health care provider about birth control (contraception). Osteoporosis and menopause  Osteoporosis is a disease in which the bones lose minerals and strength with aging. This can result in serious bone fractures. Your risk for osteoporosis can be identified using a bone density scan.  If you are 79 years of age or older, or if you are at risk for osteoporosis and fractures, ask your health care provider if you should be screened.  Ask your health care provider whether you should take a calcium or vitamin D supplement to lower your risk for osteoporosis.  Menopause may have certain physical symptoms and risks.  Hormone replacement therapy may reduce some of these symptoms and risks. Talk to your health care provider about whether hormone replacement therapy is right for you. Follow these instructions at home:  Schedule regular health, dental, and eye exams.  Stay current with your immunizations.  Do not use any tobacco products including cigarettes, chewing tobacco, or electronic cigarettes.  If you are pregnant, do not drink alcohol.  If you are breastfeeding, limit how much and how often you drink alcohol.  Limit alcohol intake to no more than 1 drink per day for nonpregnant women. One drink equals 12 ounces of beer, 5 ounces of wine, or 1 ounces of hard liquor.  Do not use street drugs.  Do not share needles.  Ask your health care provider for  help if you need support or information about quitting drugs.  Tell your health care provider if you often feel depressed.  Tell your health care provider if you have ever been abused or do not feel safe at home. This information is not intended to replace advice given to you by your health care provider. Make sure you discuss any questions you have with your health care provider. Document Released: 05/29/2011 Document Revised: 04/20/2016 Document Reviewed: 08/17/2015 Elsevier Interactive Patient Education  2019 [employer].

## 2019-03-25 NOTE — Progress Notes (Signed)
Pamela Thornton 1991/09/21 [id number]    History:    Presents for annual exam.  12/2017 Nexplanon (second one) rare bleeding.  Normal Pap history.  Gardasil series completed.  Same partner, denies need for STD screen.  History of migraines currently on daily medication and seeing a neurologist for management.  Past medical history, past surgical history, family history and social history were all reviewed and documented in the EPIC chart.  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  Father diabetes.  ROS:  A ROS was performed and pertinent positives and negatives are included.  Exam:  Vitals:   03/25/19 1004  BP: 126/80  Weight: 204 lb (92.5 kg)  Height: 5\' 4"  (1.626 m)   Body mass index is 35.02 kg/m.   General appearance:  Normal Thyroid:  Symmetrical, normal in size, without palpable masses or nodularity. Respiratory  Auscultation:  Clear without wheezing or rhonchi Cardiovascular  Auscultation:  Regular rate, without rubs, murmurs or gallops  Edema/varicosities:  Not grossly evident Abdominal  Soft,nontender, without masses, guarding or rebound.  Liver/spleen:  No organomegaly noted  Hernia:  None appreciated  Skin  Inspection:  Grossly normal   Breasts: Examined lying and sitting.     Right: Without masses, retractions, discharge or axillary adenopathy.     Left: Without masses, retractions, discharge or axillary adenopathy. Gentitourinary   Inguinal/mons:  Normal without inguinal adenopathy  External genitalia:  Normal  BUS/Urethra/Skene's glands:  Normal  Vagina:  Normal  Cervix:  Normal  Uterus:   normal in size, shape and contour.  Midline and mobile  Adnexa/parametria:     Rt: Without masses or tenderness.   Lt: Without masses or tenderness.  Anus and perineum: Normal    Assessment/Plan:  28 y.o. S W[place] G0 for annual exam complaint of mild vaginal irritation with itching for the past few weeks.  12/2017 Nexplanon rare bleeding Vaginal  itching Migraines without our neurologist managing Obesity  Plan: Reviewed wet prep negative, vaginal walls non-erythemic, erythema mostly external will try Terazol 7 externally at bedtime, instructed to call if continued symptoms.  Yeast prevention discussed.  SBEs, increasing regular exercise, decreasing calorie/carbs, MVI daily encouraged.  Aware Nexplanon is good for 3 years.  CBC, glucose, Pap normal 2019, new screening guidelines reviewed.    Harrington Challenger [facility], 10:55 AM 03/25/2019

## 2019-03-29 LAB — CBC WITH DIFFERENTIAL/PLATELET
Absolute Monocytes: 644 cells/uL (ref 200–950)
Basophils Absolute: 52 cells/uL (ref 0–200)
Basophils Relative: 0.7 %
Eosinophils Absolute: 170 cells/uL (ref 15–500)
Eosinophils Relative: 2.3 %
HCT: 42.7 % (ref 35.0–45.0)
Hemoglobin: 14.7 g/dL (ref 11.7–15.5)
Lymphs Abs: 1857 cells/uL (ref 850–3900)
MCH: 30.8 pg (ref 27.0–33.0)
MCHC: 34.4 g/dL (ref 32.0–36.0)
MCV: 89.3 fL (ref 80.0–100.0)
MPV: 10.1 fL (ref 7.5–12.5)
Monocytes Relative: 8.7 %
Neutro Abs: 4677 cells/uL (ref 1500–7800)
Neutrophils Relative %: 63.2 %
Platelets: 319 10*3/uL (ref 140–400)
RBC: 4.78 10*6/uL (ref 3.80–5.10)
RDW: 12.3 % (ref 11.0–15.0)
Total Lymphocyte: 25.1 %
WBC: 7.4 10*3/uL (ref 3.8–10.8)

## 2019-03-29 LAB — TEST AUTHORIZATION

## 2019-03-29 LAB — HEMOGLOBIN A1C W/OUT EAG: Hgb A1c MFr Bld: 5.5 % of total Hgb (ref <5.7)

## 2019-03-29 LAB — GLUCOSE, RANDOM: Glucose, Bld: 130 mg/dL — ABNORMAL HIGH (ref 65–99)

## 2019-04-01 ENCOUNTER — Ambulatory Visit: Payer: BLUE CROSS/BLUE SHIELD | Admitting: Gynecology

## 2019-04-14 ENCOUNTER — Telehealth: Payer: Self-pay

## 2019-04-14 NOTE — Telephone Encounter (Signed)
Harriett Sine sent patient a My CHart email informing her regarding lab results. It read:  "Marua, random blood sugar was high, 130, (normal 70-100) and your HGB A1c, was at the high end of normal. This can be fixed, loose the wt you gained this past year, exercise 30 min most days of the week, avoid simple sugars, juice, soda, deserts. This can be turned around to prevent diabetes by making some changes. You can do it!!! Let me know that you got this message. Stay well  Harriett Sine "  I called patient and per DPR access note on file I left message that NY left My Chart email but it returned unread so I was calling to inform her. I read her NY's message as written. I asked her to call me if any questions and left my direct phone number.

## 2019-04-14 NOTE — Telephone Encounter (Signed)
Thank you :)

## 2019-05-10 ENCOUNTER — Emergency Department (HOSPITAL_COMMUNITY): Payer: BC Managed Care – PPO

## 2019-05-10 ENCOUNTER — Encounter (HOSPITAL_COMMUNITY): Payer: Self-pay | Admitting: Emergency Medicine

## 2019-05-10 ENCOUNTER — Emergency Department (HOSPITAL_COMMUNITY)
Admission: EM | Admit: 2019-05-10 | Discharge: 2019-05-10 | Disposition: A | Discharge status: Home / Self Care | Payer: BC Managed Care – PPO | Attending: Emergency Medicine | Admitting: Emergency Medicine

## 2019-05-10 ENCOUNTER — Other Ambulatory Visit: Payer: Self-pay

## 2019-05-10 DIAGNOSIS — Y92009 Unspecified place in unspecified non-institutional (private) residence as the place of occurrence of the external cause: Secondary | ICD-10-CM | POA: Diagnosis not present

## 2019-05-10 DIAGNOSIS — S99911A Unspecified injury of right ankle, initial encounter: Secondary | ICD-10-CM | POA: Diagnosis present

## 2019-05-10 DIAGNOSIS — S8254XA Nondisplaced fracture of medial malleolus of right tibia, initial encounter for closed fracture: Secondary | ICD-10-CM

## 2019-05-10 DIAGNOSIS — Z79899 Other long term (current) drug therapy: Secondary | ICD-10-CM | POA: Diagnosis not present

## 2019-05-10 DIAGNOSIS — Y9389 Activity, other specified: Secondary | ICD-10-CM | POA: Insufficient documentation

## 2019-05-10 DIAGNOSIS — X501XXA Overexertion from prolonged static or awkward postures, initial encounter: Secondary | ICD-10-CM | POA: Insufficient documentation

## 2019-05-10 DIAGNOSIS — Y999 Unspecified external cause status: Secondary | ICD-10-CM | POA: Diagnosis not present

## 2019-05-10 DIAGNOSIS — Z87891 Personal history of nicotine dependence: Secondary | ICD-10-CM | POA: Diagnosis not present

## 2019-05-10 HISTORY — DX: Migraine, unspecified, not intractable, without status migrainosus: G43.909

## 2019-05-10 MED ORDER — IBUPROFEN 400 MG PO TABS
600.0000 mg | ORAL_TABLET | Freq: Once | ORAL | Status: AC
  Administered 2019-05-10: 600 mg via ORAL
  Filled 2019-05-10: qty 1

## 2019-05-10 MED ORDER — OXYCODONE-ACETAMINOPHEN 5-325 MG PO TABS: 1.0000 | ORAL_TABLET | ORAL | 0 refills | Status: AC | PRN

## 2019-05-10 NOTE — ED Triage Notes (Signed)
Pt reports ankle pain that occurred after she climbed a tree to get her pet's toy. Jumping down out the tree she thought she landed well, but then her ankle turned out and popped a few times and then she fell down. She crawled back to her house, elevated it and put frozen veggies on it. After about an hour, she still had pain and unable to bear weight. Pt denies hitting head or any other part of her body. Pain is minimal at about a 2 without weight but far more like an 8 with weight.

## 2019-05-10 NOTE — ED Provider Notes (Signed)
[facility]   CSN: [id number] Arrival date & time: 05/10/19  1757    History   Chief Complaint No chief complaint on file.   HPI Freeman CaldronOlivia Stangler is a 28 y.o. female who presents to the ED complaining of sudden onset, constant, 10/10, throbbing, right ankle pain that began a couple of hours ago. Pt reports that she climbed a tree attempting to get a dog toy out of it and when she dropped down out of the tree she inverted her ankle and heard multiple pops in her ankle, causing her to drop down to the ground. No head injury or LOC. Pt crawled back inside and iced the ankle without relief, prompting her to come to the ED. Pt unable to bare weight due to pain. No other complaints at this time.        Past Medical History:  Diagnosis Date  . Migraines   . Obesity   . Speech impediment     Patient Active Problem List   Diagnosis Date Noted  . Nexplanon in place 03/25/2019    Past Surgical History:  Procedure Laterality Date  . Nexplanon  01/16/2018     OB History    Gravida  0   Para      Term      Preterm      AB      Living        SAB      TAB      Ectopic      Multiple      Live Births               Home Medications    Prior to Admission medications   Medication Sig Start Date End Date Taking? Authorizing Provider  citalopram (CELEXA) 40 MG tablet Take 40 mg by mouth at bedtime.    [provider]  dexmethylphenidate (FOCALIN XR) 20 MG 24 hr capsule Take 1 capsule by mouth daily. 02/14/19   [provider]  etonogestrel (NEXPLANON) 68 MG IMPL implant 1 each once by Subdermal route.    [provider]  hydrOXYzine (ATARAX/VISTARIL) 25 MG tablet Take 25 mg by mouth See admin instructions. Daily as needed for anxiety and every night    [provider]  oxyCODONE-acetaminophen (PERCOCET/ROXICET) 5-325 MG tablet Take 1 tablet by mouth every 4 (four) hours as needed  for severe pain. 05/10/19   Tanda RockersVenter, Bren Steers, PA-C  terconazole (TERAZOL 7) 0.4 % vaginal cream Place 1 applicator vaginally at bedtime. 03/25/19   Harrington ChallengerYoung, Nancy J, NP  tiZANidine (ZANAFLEX) 4 MG tablet Take 4 mg by mouth every 6 (six) hours as needed for muscle spasms.    [provider]  zonisamide (ZONEGRAN) 100 MG capsule Take 4 capsules by mouth daily. 03/17/19   [provider]    Family History Family History  Problem Relation Age of Onset  . Cancer Mother        Tonsil cancer  . Diabetes Father        type 2  . Migraines Father     Social History Social History   Tobacco Use  . Smoking status: Former Smoker    Types: Cigarettes    Start date: 08/08/2011  . Smokeless tobacco: Never Used  Substance Use Topics  . Alcohol use: Yes    Alcohol/week: 0.0 standard drinks    Comment: rarely  . Drug use: No     Allergies   Patient  has no known allergies.   Review of Systems Review of Systems  Musculoskeletal: Positive for arthralgias, gait problem and joint swelling.  Skin: Negative for wound.  Neurological: Negative for syncope and headaches.     Physical Exam Updated Vital Signs Ht 5\' 4"  (1.626 m)   Wt 90.3 kg   BMI 34.16 kg/m   Physical Exam Vitals signs and nursing note reviewed.  Constitutional:      Appearance: She is not ill-appearing.  HENT:     Head: Normocephalic and atraumatic.  Eyes:     Conjunctiva/sclera: Conjunctivae normal.  Cardiovascular:     Rate and Rhythm: Normal rate and regular rhythm.  Pulmonary:     Effort: Pulmonary effort is normal.     Breath sounds: Normal breath sounds.  Musculoskeletal:     Comments: Minimal swelling to right ankle; TTP to medial malleolus; ROM intact; able to dorsiflex and plantarflex without issue; strength 5/5, sensation intact throughout,2+ DP and PT pulse  Skin:    General: Skin is warm and dry.     Coloration: Skin is not jaundiced.  Neurological:     Mental Status: She is alert.       ED Treatments / Results  Labs (all labs ordered are listed, but only abnormal results are displayed) Labs Reviewed - No data to display  EKG None  Radiology Dg Ankle Complete Right  Result Date: 05/10/2019 CLINICAL DATA:  Pt jumped down out of a tree and thought she landed well, but then her ankle turned out and popped a few times and then she fell down. Pt elevated and iced her ankle, but after about an hour, she still had pain and was unable to bear weight. inverted ankle, pain to medial malleolus EXAM: RIGHT ANKLE - COMPLETE 3+ VIEW COMPARISON:  None. FINDINGS: Vertical fracture of the posterior malleolus of the tibia. Ankle mortise intact. Talar dome is normal. Os peroneum noted. IMPRESSION: Fracture posterior malleolus. Electronically Signed   By: Suzy Bouchard M.D.   On: 05/10/2019 18:57    Procedures Procedures (including critical care time)  Medications Ordered in ED Medications  ibuprofen (ADVIL) tablet 600 mg (600 mg Oral Given 05/10/19 1838)     Initial Impression / Assessment and Plan / ED Course  I have reviewed the triage vital signs and the nursing notes.  Pertinent labs & imaging results that were available during my care of the patient were reviewed by me and considered in my medical decision making (see chart for details).    Pt is a 28 year old female who presents to the ED for right ankle pain after inverting ankle; unable to bear weight onto ankle. Ankle does not appear significantly swollen but given pt cannot bear weight and has TTP to medial malleolus will obtain xray given [place] [employer]le Rules. Pt given Ibuprofen in the ED as she has not taken anything for pain since the incident.   Xray with posterior malleolus fracture of tibia; posterior leg splint placed in the ED and crutches given; pt to follow up with orthopedist. She reports she saw an orthopedist in the past but cannot remember who it was; will give her name of ortho on call today. Pain medication  given as well. Pt in agreement with plan at this time and stable for discharge home.       Final Clinical Impressions(s) / ED Diagnoses   Final diagnoses:  Nondisplaced fracture of medial malleolus of right tibia, initial encounter for closed fracture  ED Discharge Orders         Ordered    oxyCODONE-acetaminophen (PERCOCET/ROXICET) 5-325 MG tablet  Every 4 hours PRN     05/10/19 1939           Tanda RockersVenter, Gleen Ripberger, PA-C 05/10/19 2343    Azalia Bilisampos, Kevin, MD 05/11/19 1105

## 2019-05-10 NOTE — Discharge Instructions (Addendum)
Please follow up with Dr. Griffin Basil regarding  your fracture; please use crutches and limit weight bearing until you are seen. Take pain medication as prescribed.

## 2019-08-18 ENCOUNTER — Encounter: Payer: Self-pay | Admitting: Gynecology

## 2020-02-10 IMAGING — CR RIGHT ANKLE - COMPLETE 3+ VIEW
3 series · 3 of 3 positions shown · non-contrast
Comparison: None.

CLINICAL DATA: Pt jumped down out of a tree and thought she landed
well, but then her ankle turned out and popped a few times and then
she fell down. Pt elevated and iced her ankle, but after about an
hour, she still had pain and was unable to bear weight. inverted
ankle, pain to medial malleolus

EXAM:
RIGHT ANKLE - COMPLETE 3+ VIEW

[ankle ap]
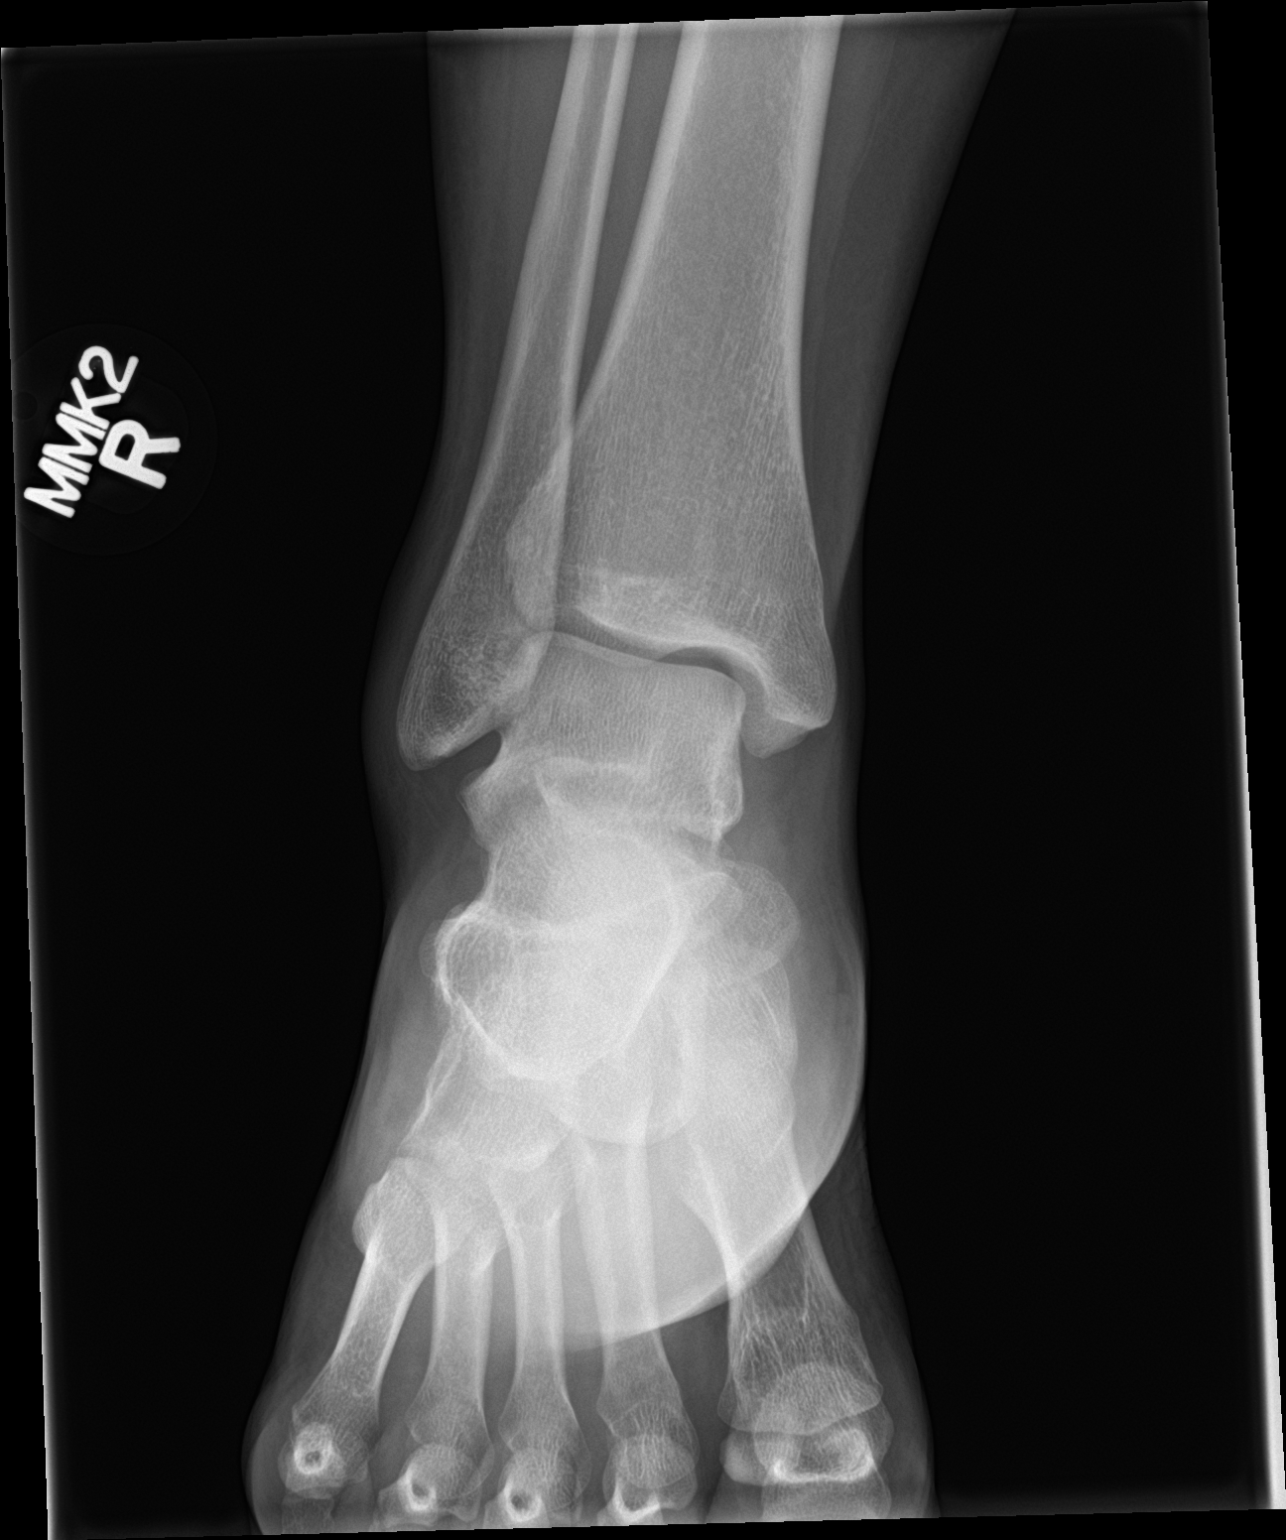

[ankle obl]
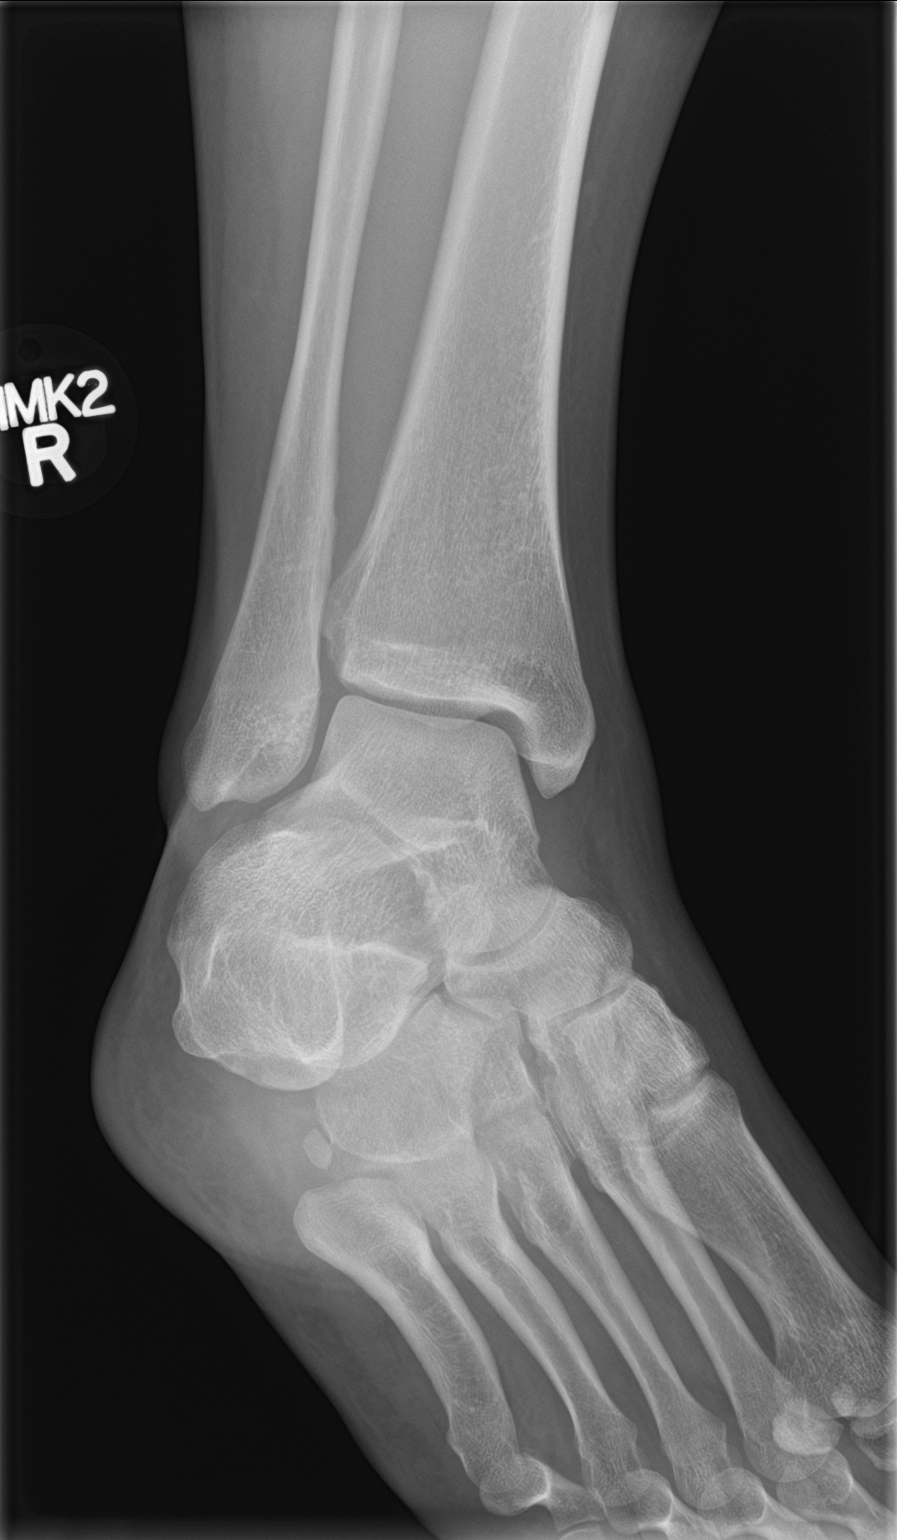

[ankle lat]
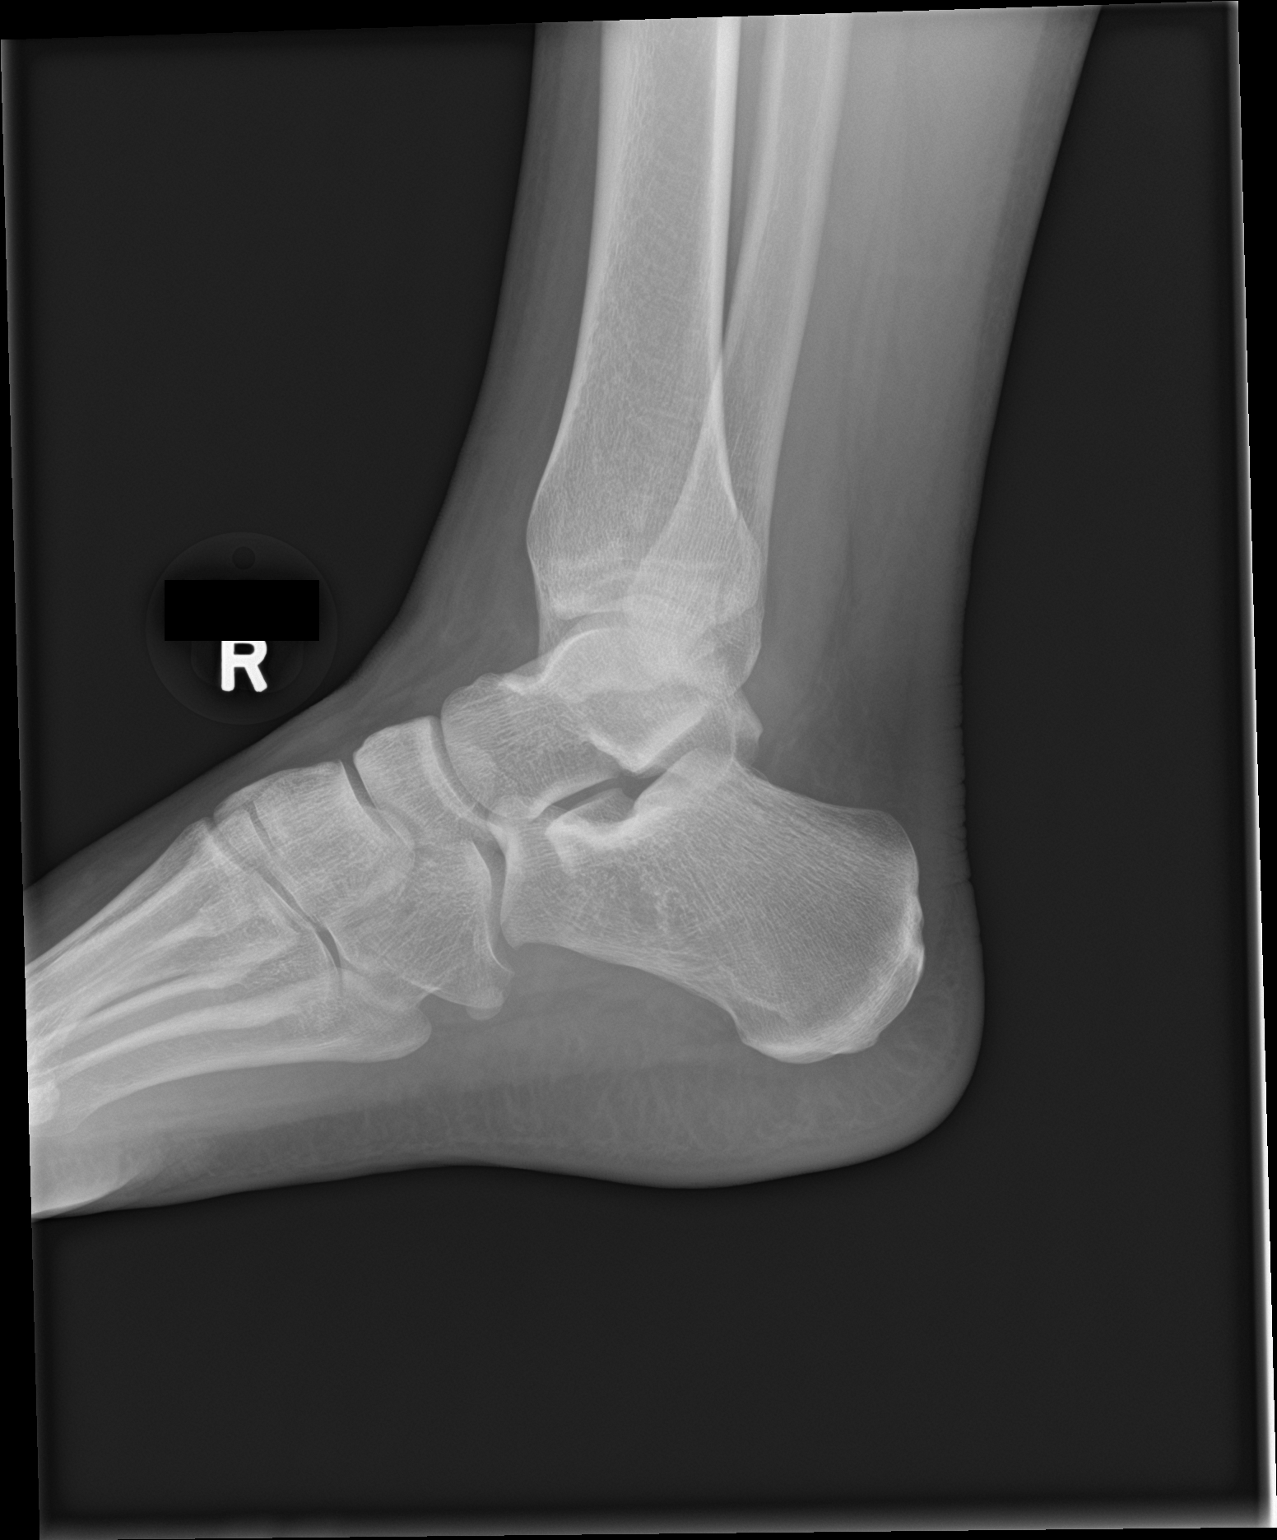

[3 of 3 positions shown; findings below may reference images not displayed]

FINDINGS: Vertical fracture of the posterior malleolus of the tibia. Ankle
mortise intact. Talar dome is normal. Os peroneum noted.
IMPRESSION: Fracture posterior malleolus.

## 2024-07-28 ENCOUNTER — Ambulatory Visit
Admission: EM | Admit: 2024-07-28 | Discharge: 2024-07-28 | Disposition: A | Discharge status: Home / Self Care | Payer: Self-pay | Attending: Emergency Medicine | Admitting: Emergency Medicine

## 2024-07-28 DIAGNOSIS — L282 Other prurigo: Secondary | ICD-10-CM

## 2024-07-28 DIAGNOSIS — W57XXXA Bitten or stung by nonvenomous insect and other nonvenomous arthropods, initial encounter: Secondary | ICD-10-CM

## 2024-07-28 MED ORDER — PREDNISONE 10 MG (21) PO TBPK: ORAL_TABLET | Freq: Every day | ORAL | 0 refills | Status: AC

## 2024-07-28 MED ORDER — CETIRIZINE HCL 10 MG PO TABS: 10.0000 mg | ORAL_TABLET | Freq: Every day | ORAL | 0 refills | Status: AC

## 2024-07-28 NOTE — ED Provider Notes (Signed)
 [facility]    CSN: [id number] Arrival date & time: 07/28/24  0915      History   Chief Complaint Chief Complaint  Patient presents with   Insect Bite    HPI Paiton Boultinghouse is a 33 y.o. female.  Patient presents with insect bites on her buttocks, lower back, posterior thighs.  The insect bites are extremely pruritic.  No fever or purulent drainage.  Treatment attempted with oral Benadryl , hydrocortisone cream, Benadryl  cream, lidocaine cream, Tylenol , ibuprofen .  No Benadryl  taken today.  The history is provided by the patient and medical records.    Past Medical History:  Diagnosis Date   Migraines    Obesity    Speech impediment     Patient Active Problem List   Diagnosis Date Noted   Nexplanon  in place 03/25/2019    Past Surgical History:  Procedure Laterality Date   Nexplanon   01/16/2018    OB History     Gravida  0   Para      Term      Preterm      AB      Living         SAB      IAB      Ectopic      Multiple      Live Births               Home Medications    Prior to Admission medications   Medication Sig Start Date End Date Taking? Authorizing Provider  cetirizine  (ZYRTEC  ALLERGY) 10 MG tablet Take 1 tablet (10 mg total) by mouth daily for 14 days. 07/28/24 08/11/24 Yes Corlis Burnard DEL, NP  predniSONE  (STERAPRED UNI-PAK 21 TAB) 10 MG (21) TBPK tablet Take by mouth daily. As directed 07/28/24  Yes Corlis Burnard DEL, NP  citalopram (CELEXA) 40 MG tablet Take 40 mg by mouth at bedtime. Patient not taking: Reported on 07/28/2024    [provider]  dexmethylphenidate (FOCALIN XR) 20 MG 24 hr capsule Take 1 capsule by mouth daily. Patient not taking: Reported on 07/28/2024 02/14/19   [provider]  etonogestrel  (NEXPLANON ) 68 MG IMPL implant 1 each once by Subdermal route.    [provider]  oxyCODONE -acetaminophen  (PERCOCET/ROXICET) 5-325 MG tablet Take 1 tablet by mouth every 4 (four) hours as needed for  severe pain. Patient not taking: Reported on 07/28/2024 05/10/19   Shepard Clinch, PA-C  terconazole  (TERAZOL 7 ) 0.4 % vaginal cream Place 1 applicator vaginally at bedtime. Patient not taking: Reported on 07/28/2024 03/25/19   Neysa Inocente PARAS, NP  tiZANidine (ZANAFLEX) 4 MG tablet Take 4 mg by mouth every 6 (six) hours as needed for muscle spasms. Patient not taking: Reported on 07/28/2024    [provider]  zonisamide (ZONEGRAN) 100 MG capsule Take 4 capsules by mouth daily. Patient not taking: Reported on 07/28/2024 03/17/19   [provider]    Family History Family History  Problem Relation Age of Onset   Cancer Mother        Tonsil cancer   Diabetes Father        type 2   Migraines Father     Social History Social History   Tobacco Use   Smoking status: Former    Types: Cigarettes    Start date: 08/08/2011   Smokeless tobacco: Never  Vaping Use   Vaping status: Never Used  Substance Use Topics   Alcohol use: Yes    Alcohol/week: 0.0  standard drinks of alcohol    Comment: rarely   Drug use: No     Allergies   Patient has no known allergies.   Review of Systems Review of Systems  Constitutional:  Negative for chills and fever.  Musculoskeletal:  Negative for arthralgias, gait problem and joint swelling.  Skin:  Positive for color change and wound.     Physical Exam Triage Vital Signs ED Triage Vitals  Encounter Vitals Group     BP 07/28/24 0955 123/82     Girls Systolic BP Percentile --      Girls Diastolic BP Percentile --      Boys Systolic BP Percentile --      Boys Diastolic BP Percentile --      Pulse Rate 07/28/24 0955 78     Resp 07/28/24 0955 17     Temp 07/28/24 0955 98 F (36.7 C)     Temp src --      SpO2 07/28/24 0955 98 %     Weight --      Height --      Head Circumference --      Peak Flow --      Pain Score 07/28/24 0950 7     Pain Loc --      Pain Education --      Exclude from Growth Chart --    No data  found.  Updated Vital Signs BP 123/82   Pulse 78   Temp 98 F (36.7 C)   Resp 17   SpO2 98%   Visual Acuity Right Eye Distance:   Left Eye Distance:   Bilateral Distance:    Right Eye Near:   Left Eye Near:    Bilateral Near:     Physical Exam Constitutional:      General: She is not in acute distress. HENT:     Mouth/Throat:     Mouth: Mucous membranes are moist.  Cardiovascular:     Rate and Rhythm: Normal rate and regular rhythm.  Pulmonary:     Effort: Pulmonary effort is normal. No respiratory distress.  Musculoskeletal:        General: No deformity. Normal range of motion.  Skin:    General: Skin is warm and dry.     Capillary Refill: Capillary refill takes less than 2 seconds.     Findings: Lesion present.     Comments: Numerous small raised erythematous papules on lower back, buttocks, posterior thighs which appear to be insect bites.  Many of these have been scratched open and are scabbed.  No drainage.  Neurological:     General: No focal deficit present.     Mental Status: She is alert.     Sensory: No sensory deficit.     Motor: No weakness.     Gait: Gait normal.      UC Treatments / Results  Labs (all labs ordered are listed, but only abnormal results are displayed) Labs Reviewed - No data to display  EKG   Radiology No results found.  Procedures Procedures (including critical care time)  Medications Ordered in UC Medications - No data to display  Initial Impression / Assessment and Plan / UC Course  I have reviewed the triage vital signs and the nursing notes.  Pertinent labs & imaging results that were available during my care of the patient were reviewed by me and considered in my medical decision making (see chart for details).    Pruritic rash due to  insect bites.  Afebrile and vital signs are stable.  Treating with prednisone  and Zyrtec .  Also instructed patient to use OTC Calamine lotion.  Education provided on insect bites.   Instructed patient to follow-up with her PCP if she is not improving.  She agrees to plan of care.  Final Clinical Impressions(s) / UC Diagnoses   Final diagnoses:  Pruritic rash  Insect bite, unspecified site, initial encounter     Discharge Instructions      Take the prednisone  taper as directed.    Take Zyrtec  as directed.    Follow-up with your primary care provider if your symptoms are not improving.      ED Prescriptions     Medication Sig Dispense Auth. Provider   predniSONE  (STERAPRED UNI-PAK 21 TAB) 10 MG (21) TBPK tablet Take by mouth daily. As directed 21 tablet Corlis Burnard DEL, NP   cetirizine  (ZYRTEC  ALLERGY) 10 MG tablet Take 1 tablet (10 mg total) by mouth daily for 14 days. 14 tablet Corlis Burnard DEL, NP      PDMP not reviewed this encounter.   Corlis Burnard DEL, NP 07/28/24 1020

## 2024-07-28 NOTE — ED Triage Notes (Signed)
 Patient to Urgent Care with complaints of multiple insect bites after sitting on an ant hill- reports areas to lower back and bilateral posterior thighs. Areas are swollen and painful/ itchy.   Symptoms x2 days.  Attempted benadryl / benadryl  cream/ tylenol / ibuprofen / lidocaine cream.

## 2024-07-28 NOTE — Discharge Instructions (Addendum)
 Take the prednisone taper as directed.    Take Zyrtec as directed.    Follow-up with your primary care provider if your symptoms are not improving.

## 2024-10-03 DIAGNOSIS — Z124 Encounter for screening for malignant neoplasm of cervix: Secondary | ICD-10-CM | POA: Diagnosis not present

## 2024-10-03 DIAGNOSIS — Z0000 Encounter for general adult medical examination without abnormal findings: Secondary | ICD-10-CM | POA: Diagnosis not present

## 2024-10-03 DIAGNOSIS — F17200 Nicotine dependence, unspecified, uncomplicated: Secondary | ICD-10-CM | POA: Diagnosis not present

## 2024-10-03 DIAGNOSIS — F332 Major depressive disorder, recurrent severe without psychotic features: Secondary | ICD-10-CM | POA: Diagnosis not present

## 2024-10-03 DIAGNOSIS — F411 Generalized anxiety disorder: Secondary | ICD-10-CM | POA: Diagnosis not present

## 2024-10-03 DIAGNOSIS — F909 Attention-deficit hyperactivity disorder, unspecified type: Secondary | ICD-10-CM | POA: Diagnosis not present

## 2024-10-06 DIAGNOSIS — M546 Pain in thoracic spine: Secondary | ICD-10-CM | POA: Diagnosis not present

## 2024-10-10 DIAGNOSIS — Z3049 Encounter for surveillance of other contraceptives: Secondary | ICD-10-CM | POA: Diagnosis not present

## 2024-10-10 DIAGNOSIS — N898 Other specified noninflammatory disorders of vagina: Secondary | ICD-10-CM | POA: Diagnosis not present

## 2024-10-10 DIAGNOSIS — Z113 Encounter for screening for infections with a predominantly sexual mode of transmission: Secondary | ICD-10-CM | POA: Diagnosis not present

## 2024-10-10 DIAGNOSIS — Z01419 Encounter for gynecological examination (general) (routine) without abnormal findings: Secondary | ICD-10-CM | POA: Diagnosis not present

## 2024-10-10 DIAGNOSIS — Z1151 Encounter for screening for human papillomavirus (HPV): Secondary | ICD-10-CM | POA: Diagnosis not present

## 2024-10-10 DIAGNOSIS — Z124 Encounter for screening for malignant neoplasm of cervix: Secondary | ICD-10-CM | POA: Diagnosis not present

## 2024-10-16 DIAGNOSIS — M546 Pain in thoracic spine: Secondary | ICD-10-CM | POA: Diagnosis not present

## 2024-10-16 DIAGNOSIS — Z113 Encounter for screening for infections with a predominantly sexual mode of transmission: Secondary | ICD-10-CM | POA: Diagnosis not present

## 2024-10-29 DIAGNOSIS — F4323 Adjustment disorder with mixed anxiety and depressed mood: Secondary | ICD-10-CM | POA: Diagnosis not present

## 2024-10-31 DIAGNOSIS — F909 Attention-deficit hyperactivity disorder, unspecified type: Secondary | ICD-10-CM | POA: Diagnosis not present

## 2024-10-31 DIAGNOSIS — F332 Major depressive disorder, recurrent severe without psychotic features: Secondary | ICD-10-CM | POA: Diagnosis not present

## 2024-11-10 DIAGNOSIS — F4323 Adjustment disorder with mixed anxiety and depressed mood: Secondary | ICD-10-CM | POA: Diagnosis not present

## 2024-11-12 DIAGNOSIS — M546 Pain in thoracic spine: Secondary | ICD-10-CM | POA: Diagnosis not present

## 2024-11-17 DIAGNOSIS — F4323 Adjustment disorder with mixed anxiety and depressed mood: Secondary | ICD-10-CM | POA: Diagnosis not present
